# Patient Record
Sex: Female | Born: 1990 | Race: Black or African American | Hispanic: No | Marital: Single | Smoking: Never smoker
Health system: Southern US, Community
[De-identification: ages and names within clinical notes are randomized; demographics above are authoritative.]

## PROBLEM LIST (undated history)

## (undated) DIAGNOSIS — R011 Cardiac murmur, unspecified: Secondary | ICD-10-CM

## (undated) DIAGNOSIS — T884XXA Failed or difficult intubation, initial encounter: Secondary | ICD-10-CM

## (undated) DIAGNOSIS — Z789 Other specified health status: Secondary | ICD-10-CM

## (undated) HISTORY — DX: Other specified health status: Z78.9

## (undated) HISTORY — PX: COSMETIC SURGERY: SHX468

## (undated) HISTORY — PX: WISDOM TOOTH EXTRACTION: SHX21

---

## 2014-08-15 ENCOUNTER — Emergency Department (HOSPITAL_COMMUNITY): Payer: 59

## 2014-08-15 ENCOUNTER — Inpatient Hospital Stay (HOSPITAL_COMMUNITY): Payer: 59

## 2014-08-15 ENCOUNTER — Emergency Department (HOSPITAL_COMMUNITY): Payer: 59 | Admitting: Certified Registered"

## 2014-08-15 ENCOUNTER — Encounter (HOSPITAL_COMMUNITY): Payer: Self-pay | Admitting: Emergency Medicine

## 2014-08-15 ENCOUNTER — Inpatient Hospital Stay (HOSPITAL_COMMUNITY)
Admission: EM | Admit: 2014-08-15 | Discharge: 2014-08-17 | DRG: 208 | Disposition: A | Discharge status: Home / Self Care | Payer: 59 | Attending: Internal Medicine | Admitting: Internal Medicine

## 2014-08-15 DIAGNOSIS — J690 Pneumonitis due to inhalation of food and vomit: Secondary | ICD-10-CM | POA: Diagnosis present

## 2014-08-15 DIAGNOSIS — W19XXXA Unspecified fall, initial encounter: Secondary | ICD-10-CM

## 2014-08-15 DIAGNOSIS — J9600 Acute respiratory failure, unspecified whether with hypoxia or hypercapnia: Secondary | ICD-10-CM

## 2014-08-15 DIAGNOSIS — IMO0002 Reserved for concepts with insufficient information to code with codable children: Secondary | ICD-10-CM | POA: Diagnosis present

## 2014-08-15 DIAGNOSIS — R55 Syncope and collapse: Secondary | ICD-10-CM | POA: Insufficient documentation

## 2014-08-15 DIAGNOSIS — J9690 Respiratory failure, unspecified, unspecified whether with hypoxia or hypercapnia: Secondary | ICD-10-CM | POA: Diagnosis present

## 2014-08-15 DIAGNOSIS — R4182 Altered mental status, unspecified: Secondary | ICD-10-CM | POA: Diagnosis present

## 2014-08-15 DIAGNOSIS — T884XXA Failed or difficult intubation, initial encounter: Secondary | ICD-10-CM

## 2014-08-15 DIAGNOSIS — F10129 Alcohol abuse with intoxication, unspecified: Secondary | ICD-10-CM | POA: Diagnosis present

## 2014-08-15 HISTORY — DX: Cardiac murmur, unspecified: R01.1

## 2014-08-15 HISTORY — DX: Failed or difficult intubation, initial encounter: T88.4XXA

## 2014-08-15 LAB — CBC
HCT: 37.1 % (ref 36.0–46.0)
Hemoglobin: 12.4 g/dL (ref 12.0–15.0)
MCH: 29.5 pg (ref 26.0–34.0)
MCHC: 33.4 g/dL (ref 30.0–36.0)
MCV: 88.1 fL (ref 78.0–100.0)
PLATELETS: 175 10*3/uL (ref 150–400)
RBC: 4.21 MIL/uL (ref 3.87–5.11)
RDW: 13.2 % (ref 11.5–15.5)
WBC: 11.2 10*3/uL — ABNORMAL HIGH (ref 4.0–10.5)

## 2014-08-15 LAB — URINALYSIS, ROUTINE W REFLEX MICROSCOPIC
BILIRUBIN URINE: NEGATIVE
Glucose, UA: NEGATIVE mg/dL
Hgb urine dipstick: NEGATIVE
Ketones, ur: NEGATIVE mg/dL
Leukocytes, UA: NEGATIVE
Nitrite: NEGATIVE
PROTEIN: NEGATIVE mg/dL
Specific Gravity, Urine: 1.03 (ref 1.005–1.030)
UROBILINOGEN UA: 1 mg/dL (ref 0.0–1.0)
pH: 5 (ref 5.0–8.0)

## 2014-08-15 LAB — BLOOD GAS, ARTERIAL
Acid-base deficit: 9 mmol/L — ABNORMAL HIGH (ref 0.0–2.0)
Bicarbonate: 16.7 mEq/L — ABNORMAL LOW (ref 20.0–24.0)
Drawn by: 422461
FIO2: 1 %
O2 Saturation: 98.9 %
PCO2 ART: 34 mmHg — AB (ref 35.0–45.0)
PEEP/CPAP: 5 cmH2O
PH ART: 7.3 — AB (ref 7.350–7.450)
Patient temperature: 95.4
RATE: 16 resp/min
TCO2: 15.4 mmol/L (ref 0–100)
VT: 420 mL
pO2, Arterial: 226 mmHg — ABNORMAL HIGH (ref 80.0–100.0)

## 2014-08-15 LAB — ETHANOL: ALCOHOL ETHYL (B): 225 mg/dL — AB (ref 0–11)

## 2014-08-15 LAB — COMPREHENSIVE METABOLIC PANEL
ALBUMIN: 4.5 g/dL (ref 3.5–5.2)
ALT: 14 U/L (ref 0–35)
AST: 22 U/L (ref 0–37)
Alkaline Phosphatase: 59 U/L (ref 39–117)
Anion gap: 20 — ABNORMAL HIGH (ref 5–15)
BUN: 11 mg/dL (ref 6–23)
CO2: 19 mEq/L (ref 19–32)
CREATININE: 0.73 mg/dL (ref 0.50–1.10)
Calcium: 9.1 mg/dL (ref 8.4–10.5)
Chloride: 104 mEq/L (ref 96–112)
GFR calc Af Amer: >90 mL/min (ref >90)
GFR calc non Af Amer: >90 mL/min (ref >90)
Glucose, Bld: 107 mg/dL — ABNORMAL HIGH (ref 70–99)
POTASSIUM: 3.7 meq/L (ref 3.7–5.3)
Sodium: 143 mEq/L (ref 137–147)
Total Bilirubin: <0.2 mg/dL — ABNORMAL LOW (ref 0.3–1.2)
Total Protein: 7.7 g/dL (ref 6.0–8.3)

## 2014-08-15 LAB — CBC WITH DIFFERENTIAL/PLATELET
BASOS ABS: 0.1 10*3/uL (ref 0.0–0.1)
BASOS PCT: 1 % (ref 0–1)
Eosinophils Absolute: 0.1 10*3/uL (ref 0.0–0.7)
Eosinophils Relative: 1 % (ref 0–5)
HCT: 37.8 % (ref 36.0–46.0)
Hemoglobin: 12.3 g/dL (ref 12.0–15.0)
Lymphocytes Relative: 49 % — ABNORMAL HIGH (ref 12–46)
Lymphs Abs: 4.3 10*3/uL — ABNORMAL HIGH (ref 0.7–4.0)
MCH: 28.7 pg (ref 26.0–34.0)
MCHC: 32.5 g/dL (ref 30.0–36.0)
MCV: 88.1 fL (ref 78.0–100.0)
Monocytes Absolute: 0.6 10*3/uL (ref 0.1–1.0)
Monocytes Relative: 7 % (ref 3–12)
NEUTROS ABS: 3.7 10*3/uL (ref 1.7–7.7)
NEUTROS PCT: 42 % — AB (ref 43–77)
Platelets: 204 10*3/uL (ref 150–400)
RBC: 4.29 MIL/uL (ref 3.87–5.11)
RDW: 13.2 % (ref 11.5–15.5)
WBC: 8.7 10*3/uL (ref 4.0–10.5)

## 2014-08-15 LAB — BASIC METABOLIC PANEL
Anion gap: 19 — ABNORMAL HIGH (ref 5–15)
BUN: 8 mg/dL (ref 6–23)
CALCIUM: 8.7 mg/dL (ref 8.4–10.5)
CO2: 15 mEq/L — ABNORMAL LOW (ref 19–32)
CREATININE: 0.66 mg/dL (ref 0.50–1.10)
Chloride: 108 mEq/L (ref 96–112)
GLUCOSE: 107 mg/dL — AB (ref 70–99)
POTASSIUM: 4 meq/L (ref 3.7–5.3)
Sodium: 142 mEq/L (ref 137–147)

## 2014-08-15 LAB — PREGNANCY, URINE: Preg Test, Ur: NEGATIVE

## 2014-08-15 LAB — RAPID URINE DRUG SCREEN, HOSP PERFORMED
Amphetamines: NOT DETECTED
BARBITURATES: NOT DETECTED
Benzodiazepines: NOT DETECTED
Cocaine: NOT DETECTED
Opiates: NOT DETECTED
TETRAHYDROCANNABINOL: NOT DETECTED

## 2014-08-15 LAB — CBG MONITORING, ED: Glucose-Capillary: 107 mg/dL — ABNORMAL HIGH (ref 70–99)

## 2014-08-15 LAB — LACTIC ACID, PLASMA: LACTIC ACID, VENOUS: 4 mmol/L — AB (ref 0.5–2.2)

## 2014-08-15 LAB — SALICYLATE LEVEL

## 2014-08-15 LAB — MAGNESIUM: Magnesium: 1.7 mg/dL (ref 1.5–2.5)

## 2014-08-15 LAB — PHOSPHORUS: Phosphorus: 1.7 mg/dL — ABNORMAL LOW (ref 2.3–4.6)

## 2014-08-15 LAB — GLUCOSE, CAPILLARY: Glucose-Capillary: 137 mg/dL — ABNORMAL HIGH (ref 70–99)

## 2014-08-15 LAB — MRSA PCR SCREENING: MRSA by PCR: NEGATIVE

## 2014-08-15 LAB — I-STAT CG4 LACTIC ACID, ED: Lactic Acid, Venous: 2.87 mmol/L — ABNORMAL HIGH (ref 0.5–2.2)

## 2014-08-15 LAB — ACETAMINOPHEN LEVEL

## 2014-08-15 MED ORDER — ROCURONIUM BROMIDE 50 MG/5ML IV SOLN
20.0000 mg | Freq: Once | INTRAVENOUS | Status: AC
  Administered 2014-08-15: 20 mg via INTRAVENOUS

## 2014-08-15 MED ORDER — MAGNESIUM SULFATE 50 % IJ SOLN: 2.0000 g | Freq: Once | INTRAVENOUS | Status: DC

## 2014-08-15 MED ORDER — DEXAMETHASONE SODIUM PHOSPHATE 10 MG/ML IJ SOLN
10.0000 mg | Freq: Once | INTRAMUSCULAR | Status: AC
  Administered 2014-08-15: 10 mg via INTRAVENOUS
  Filled 2014-08-15: qty 1

## 2014-08-15 MED ORDER — ETOMIDATE 2 MG/ML IV SOLN
10.0000 mg | Freq: Once | INTRAVENOUS | Status: AC
  Administered 2014-08-15: 10 mg via INTRAVENOUS

## 2014-08-15 MED ORDER — SUCCINYLCHOLINE CHLORIDE 20 MG/ML IJ SOLN
INTRAMUSCULAR | Status: AC
  Filled 2014-08-15: qty 1

## 2014-08-15 MED ORDER — ROCURONIUM BROMIDE 50 MG/5ML IV SOLN
INTRAVENOUS | Status: AC
  Administered 2014-08-15: 80 mg via INTRAVENOUS
  Filled 2014-08-15: qty 2

## 2014-08-15 MED ORDER — ENOXAPARIN SODIUM 40 MG/0.4ML ~~LOC~~ SOLN
40.0000 mg | Freq: Every day | SUBCUTANEOUS | Status: DC
  Administered 2014-08-15 – 2014-08-16 (×2): 40 mg via SUBCUTANEOUS
  Filled 2014-08-15 (×3): qty 0.4

## 2014-08-15 MED ORDER — ASPIRIN 300 MG RE SUPP: 300.0000 mg | RECTAL | Status: AC

## 2014-08-15 MED ORDER — FENTANYL CITRATE 0.05 MG/ML IJ SOLN
100.0000 ug | INTRAMUSCULAR | Status: DC | PRN
  Administered 2014-08-15 – 2014-08-16 (×3): 100 ug via INTRAVENOUS
  Filled 2014-08-15 (×3): qty 2

## 2014-08-15 MED ORDER — MIDAZOLAM HCL 2 MG/2ML IJ SOLN
2.0000 mg | Freq: Once | INTRAMUSCULAR | Status: AC
  Administered 2014-08-15: 2 mg via INTRAVENOUS
  Filled 2014-08-15: qty 2

## 2014-08-15 MED ORDER — SODIUM CHLORIDE 0.9 % IV BOLUS (SEPSIS)
2000.0000 mL | Freq: Once | INTRAVENOUS | Status: AC
  Administered 2014-08-15 (×2): 1000 mL via INTRAVENOUS

## 2014-08-15 MED ORDER — CHLORHEXIDINE GLUCONATE 0.12 % MT SOLN
15.0000 mL | Freq: Two times a day (BID) | OROMUCOSAL | Status: DC
  Administered 2014-08-15 – 2014-08-17 (×5): 15 mL via OROMUCOSAL
  Filled 2014-08-15 (×4): qty 15

## 2014-08-15 MED ORDER — ASPIRIN 81 MG PO CHEW: 324.0000 mg | CHEWABLE_TABLET | ORAL | Status: AC

## 2014-08-15 MED ORDER — GLYCOPYRROLATE 0.2 MG/ML IJ SOLN
0.2000 mg | Freq: Once | INTRAMUSCULAR | Status: AC
  Administered 2014-08-15: 0.2 mg via INTRAVENOUS
  Filled 2014-08-15: qty 1

## 2014-08-15 MED ORDER — SODIUM CHLORIDE 0.9 % IV BOLUS (SEPSIS): 2000.0000 mL | Freq: Once | INTRAVENOUS | Status: DC

## 2014-08-15 MED ORDER — ETOMIDATE 2 MG/ML IV SOLN
INTRAVENOUS | Status: AC
  Administered 2014-08-15: 20 mg via INTRAVENOUS
  Filled 2014-08-15: qty 20

## 2014-08-15 MED ORDER — INFLUENZA VAC SPLIT QUAD 0.5 ML IM SUSY
0.5000 mL | PREFILLED_SYRINGE | INTRAMUSCULAR | Status: AC
Start: 2014-08-16 — End: 2014-08-17
  Administered 2014-08-17: 0.5 mL via INTRAMUSCULAR
  Filled 2014-08-15: qty 0.5

## 2014-08-15 MED ORDER — PIPERACILLIN-TAZOBACTAM 3.375 G IVPB: 3.3750 g | Freq: Four times a day (QID) | INTRAVENOUS | Status: DC

## 2014-08-15 MED ORDER — FENTANYL CITRATE 0.05 MG/ML IJ SOLN
100.0000 ug | INTRAMUSCULAR | Status: AC | PRN
  Administered 2014-08-15 (×3): 100 ug via INTRAVENOUS
  Filled 2014-08-15 (×3): qty 2

## 2014-08-15 MED ORDER — ETOMIDATE 2 MG/ML IV SOLN
INTRAVENOUS | Status: AC
  Filled 2014-08-15: qty 10

## 2014-08-15 MED ORDER — VANCOMYCIN HCL IN DEXTROSE 1-5 GM/200ML-% IV SOLN
1000.0000 mg | Freq: Once | INTRAVENOUS | Status: DC
  Administered 2014-08-15: 1000 mg via INTRAVENOUS
  Filled 2014-08-15: qty 200

## 2014-08-15 MED ORDER — PIPERACILLIN-TAZOBACTAM 3.375 G IVPB 30 MIN
3.3750 g | Freq: Once | INTRAVENOUS | Status: DC
  Filled 2014-08-15: qty 50

## 2014-08-15 MED ORDER — ACETAMINOPHEN 160 MG/5ML PO SOLN
650.0000 mg | Freq: Four times a day (QID) | ORAL | Status: DC | PRN
  Filled 2014-08-15: qty 20.3

## 2014-08-15 MED ORDER — PROPOFOL 10 MG/ML IV EMUL: 5.0000 ug/kg/min | INTRAVENOUS | Status: DC

## 2014-08-15 MED ORDER — SODIUM CHLORIDE 0.9 % IV SOLN
10.0000 ug/h | INTRAVENOUS | Status: DC
  Filled 2014-08-15: qty 50

## 2014-08-15 MED ORDER — LIDOCAINE HCL 2 % EX GEL
CUTANEOUS | Status: AC
  Administered 2014-08-15: 1 via ENDOTRACHEOPULMONARY
  Filled 2014-08-15: qty 10

## 2014-08-15 MED ORDER — SODIUM PHOSPHATE 3 MMOLE/ML IV SOLN
20.0000 mmol | Freq: Once | INTRAVENOUS | Status: AC
  Administered 2014-08-15: 20 mmol via INTRAVENOUS
  Filled 2014-08-15: qty 6.67

## 2014-08-15 MED ORDER — ACETAMINOPHEN 160 MG/5ML PO SOLN
650.0000 mg | Freq: Once | ORAL | Status: AC
  Administered 2014-08-15: 650 mg

## 2014-08-15 MED ORDER — PIPERACILLIN-TAZOBACTAM 3.375 G IVPB
3.3750 g | Freq: Three times a day (TID) | INTRAVENOUS | Status: DC
  Administered 2014-08-15 – 2014-08-17 (×6): 3.375 g via INTRAVENOUS
  Filled 2014-08-15 (×8): qty 50

## 2014-08-15 MED ORDER — PIPERACILLIN-TAZOBACTAM 3.375 G IVPB 30 MIN
3.3750 g | Freq: Once | INTRAVENOUS | Status: AC
  Administered 2014-08-15: 3.375 g via INTRAVENOUS
  Filled 2014-08-15: qty 50

## 2014-08-15 MED ORDER — VANCOMYCIN HCL IN DEXTROSE 1-5 GM/200ML-% IV SOLN: 1000.0000 mg | Freq: Two times a day (BID) | INTRAVENOUS | Status: DC

## 2014-08-15 MED ORDER — MAGNESIUM SULFATE 2 GM/50ML IV SOLN
2.0000 g | Freq: Once | INTRAVENOUS | Status: AC
  Administered 2014-08-15: 2 g via INTRAVENOUS
  Filled 2014-08-15: qty 50

## 2014-08-15 MED ORDER — VANCOMYCIN HCL IN DEXTROSE 1-5 GM/200ML-% IV SOLN
1000.0000 mg | Freq: Two times a day (BID) | INTRAVENOUS | Status: DC
  Administered 2014-08-15 – 2014-08-17 (×5): 1000 mg via INTRAVENOUS
  Filled 2014-08-15 (×6): qty 200

## 2014-08-15 MED ORDER — PANTOPRAZOLE SODIUM 40 MG IV SOLR
40.0000 mg | Freq: Every day | INTRAVENOUS | Status: DC
  Administered 2014-08-15 – 2014-08-16 (×2): 40 mg via INTRAVENOUS
  Filled 2014-08-15 (×3): qty 40

## 2014-08-15 MED ORDER — SODIUM CHLORIDE 0.9 % IV SOLN: 250.0000 mL | INTRAVENOUS | Status: DC | PRN

## 2014-08-15 MED ORDER — LIDOCAINE HCL (CARDIAC) 20 MG/ML IV SOLN
INTRAVENOUS | Status: AC
  Filled 2014-08-15: qty 5

## 2014-08-15 NOTE — ED Notes (Signed)
Critical care MD at bedside speaking w/ family.

## 2014-08-15 NOTE — ED Provider Notes (Signed)
CSN: [id number]     Arrival date & time 08/15/14  0145 History   First MD Initiated Contact with Patient 08/15/14 0206     Chief Complaint  Patient presents with  . Altered Mental Status     (Consider location/radiation/quality/duration/timing/severity/associated sxs/prior Treatment) Patient is a 23 y.o. female presenting with altered mental status. The history is provided by the EMS personnel. The history is limited by the condition of the patient (unresponsive).  Altered Mental Status Presenting symptoms: unresponsiveness   Severity:  Severe Most recent episode:  Today Episode history:  Single Timing:  Constant Progression:  Unchanged Chronicity:  New Context: alcohol use   Context comment:  Fall forward breaking of left upper front tooth Associated symptoms: vomiting   Associated symptoms: no rash   Associated symptoms comment:  Vomiting prior to EMS arrival and multiple for EMS   Past Medical History  Diagnosis Date  . Heart murmur    History reviewed. No pertinent past surgical history. History reviewed. No pertinent family history. History  Substance Use Topics  . Smoking status: Unknown If Ever Smoked  . Smokeless tobacco: Not on file  . Alcohol Use: Yes   OB History    No data available     Review of Systems  Unable to perform ROS Gastrointestinal: Positive for vomiting.  Skin: Negative for rash.      Allergies  Review of patient's allergies indicates no known allergies.  Home Medications   Prior to Admission medications   Not on File   BP 121/67 mmHg  Pulse 121  Resp 26  SpO2 100%  LMP  (LMP Unknown) Physical Exam  Constitutional: She appears well-developed.  HENT:  Head: Head is without raccoon's eyes and without Battle's sign.  Right Ear: No hemotympanum.  Mouth/Throat: Oropharynx is clear and moist. No posterior oropharyngeal erythema.    Eyes: Conjunctivae are normal.  Neck:  In c collar trachea midline  Cardiovascular: Intact  distal pulses.  Tachycardia present.   Pulmonary/Chest: She has rhonchi.  Abdominal: Bowel sounds are normal. She exhibits no mass. There is no guarding.  Musculoskeletal: She exhibits no edema.  Neurological: GCS eye subscore is 1. GCS verbal subscore is 1. GCS motor subscore is 4.  Skin: Skin is warm and dry. She is not diaphoretic.  Psychiatric:  unable    ED Course  Procedures (including critical care time) Labs Review Labs Reviewed  CBC WITH DIFFERENTIAL - Abnormal; Notable for the following:    Neutrophils Relative % 42 (*)    Lymphocytes Relative 49 (*)    Lymphs Abs 4.3 (*)    All other components within normal limits  COMPREHENSIVE METABOLIC PANEL - Abnormal; Notable for the following:    Glucose, Bld 107 (*)    Total Bilirubin <0.2 (*)    Anion gap 20 (*)    All other components within normal limits  ETHANOL - Abnormal; Notable for the following:    Alcohol, Ethyl (B) 225 (*)    All other components within normal limits  SALICYLATE LEVEL - Abnormal; Notable for the following:    Salicylate Lvl <2.0 (*)    All other components within normal limits  I-STAT CG4 LACTIC ACID, ED - Abnormal; Notable for the following:    Lactic Acid, Venous 2.87 (*)    All other components within normal limits  CBG MONITORING, ED - Abnormal; Notable for the following:    Glucose-Capillary 107 (*)    All other components within normal limits  CULTURE, BLOOD (  ROUTINE X 2)  CULTURE, BLOOD (ROUTINE X 2)  CULTURE, BLOOD (ROUTINE X 2)  CULTURE, BLOOD (ROUTINE X 2)  ACETAMINOPHEN LEVEL  PREGNANCY, URINE  URINALYSIS, ROUTINE W REFLEX MICROSCOPIC  URINE RAPID DRUG SCREEN (HOSP PERFORMED)  CBC  CREATININE, SERUM  BASIC METABOLIC PANEL  BLOOD GAS, ARTERIAL  MAGNESIUM  PHOSPHORUS  CBC  LACTIC ACID, PLASMA    Imaging Review Ct Head Wo Contrast  08/15/2014   CLINICAL DATA:  Patient drank a pt of liquor and probable forward, falling. Patient has abrasions to the left knee with left  frontal 2 chipped. Minimally responsive. Altered mental status.  EXAM: CT HEAD WITHOUT CONTRAST  CT CERVICAL SPINE WITHOUT CONTRAST  TECHNIQUE: Multidetector CT imaging of the head and cervical spine was performed following the standard protocol without intravenous contrast. Multiplanar CT image reconstructions of the cervical spine were also generated.  COMPARISON:  None.  FINDINGS: CT HEAD FINDINGS  Ventricles and sulci appear symmetrical. No mass effect or midline shift. No abnormal extra-axial fluid collections. Gray-white matter junctions are distinct. Basal cisterns are not effaced. No evidence of acute intracranial hemorrhage. No depressed skull fractures. Air-fluid levels are demonstrated in both maxillary antra. This could indicate pre-existing sinusitis or may be due to trauma. No visualized facial fractures identified within the field of view. Mastoid air cells are not opacified.  CT CERVICAL SPINE FINDINGS  Reversal of the usual cervical lordosis. This may be due to patient positioning but ligamentous injury or muscle spasm could also have this appearance and is not excluded. No anterior subluxation. Normal alignment of the facet joints. C1-2 articulation appears intact. No prevertebral soft tissue swelling. Endotracheal tube is visualized. No vertebral compression deformities. No focal bone lesion or bone destruction. Bone cortex and trabecular architecture appear intact. Soft tissues are unremarkable. There is opacification of the left lung apex suggesting a large pleural effusion or consolidation.  IMPRESSION: No acute intracranial abnormalities. Nonspecific air-fluid levels in the maxillary antra.  Nonspecific reversal of the usual cervical lordosis. No displaced fractures identified. Opacification of the visualized left apex which may indicate large effusion or consolidation.   Electronically Signed   By: Burman Nieves M.D.   On: 08/15/2014 03:30   Ct Cervical Spine Wo Contrast  08/15/2014    CLINICAL DATA:  Patient drank a pt of liquor and probable forward, falling. Patient has abrasions to the left knee with left frontal 2 chipped. Minimally responsive. Altered mental status.  EXAM: CT HEAD WITHOUT CONTRAST  CT CERVICAL SPINE WITHOUT CONTRAST  TECHNIQUE: Multidetector CT imaging of the head and cervical spine was performed following the standard protocol without intravenous contrast. Multiplanar CT image reconstructions of the cervical spine were also generated.  COMPARISON:  None.  FINDINGS: CT HEAD FINDINGS  Ventricles and sulci appear symmetrical. No mass effect or midline shift. No abnormal extra-axial fluid collections. Gray-white matter junctions are distinct. Basal cisterns are not effaced. No evidence of acute intracranial hemorrhage. No depressed skull fractures. Air-fluid levels are demonstrated in both maxillary antra. This could indicate pre-existing sinusitis or may be due to trauma. No visualized facial fractures identified within the field of view. Mastoid air cells are not opacified.  CT CERVICAL SPINE FINDINGS  Reversal of the usual cervical lordosis. This may be due to patient positioning but ligamentous injury or muscle spasm could also have this appearance and is not excluded. No anterior subluxation. Normal alignment of the facet joints. C1-2 articulation appears intact. No prevertebral soft tissue swelling. Endotracheal tube is visualized.  No vertebral compression deformities. No focal bone lesion or bone destruction. Bone cortex and trabecular architecture appear intact. Soft tissues are unremarkable. There is opacification of the left lung apex suggesting a large pleural effusion or consolidation.  IMPRESSION: No acute intracranial abnormalities. Nonspecific air-fluid levels in the maxillary antra.  Nonspecific reversal of the usual cervical lordosis. No displaced fractures identified. Opacification of the visualized left apex which may indicate large effusion or consolidation.    Electronically Signed   By: Burman Nieves M.D.   On: 08/15/2014 03:30   Dg Chest Port 1 View  08/15/2014   CLINICAL DATA:  Found down, intoxication. Minimally responsive to sternal rubs.  EXAM: PORTABLE CHEST - 1 VIEW  COMPARISON:  None.  FINDINGS: Endotracheal tube tip projects 3.5 cm above the carina. No pneumothorax.  Dense consolidation RIGHT upper lobe, with volume loss and patchy RIGHT mid and lower lung zone airspace opacities. Tapering in the RIGHT upper lobe bronchus. No pleural effusions. Cardiac silhouette appears normal in size.  Gas distended stomach.  Osseous structures unremarkable.  IMPRESSION: Endotracheal tube tip projects 3.5 cm above the carina.  Dense consolidation RIGHT upper lobe with apparent tapering of the RIGHT upper lobe bronchus concerning for aspiration, possible endo bronchial lesion/mass. Patchy airspace opacity in the RIGHT lung may be infectious or inflammatory.   Electronically Signed   By: Awilda Metro   On: 08/15/2014 03:55     EKG Interpretation None      MDM   Final diagnoses:  Fall  Aspiration pneumonia, unspecified aspiration pneumonia type    INTUBATION Performed by: Jasmine Awe  Required items: required blood products, implants, devices, and special equipment available Patient identity confirmed: provided demographic data and hospital-assigned identification number Time out: Immediately prior to procedure a "time out" was called to verify the correct patient, procedure, equipment, support staff and site/side marked as required.  Indications: unresponsive no gag   Intubation method: Glidescope Laryngoscopy   Preoxygenation: \BVM  Sedatives: 20 mg Etomidate Paralytic: 100 mg rocuronium  Tube Size: 7.0  cuffed   3 attempted with Cormack Lehane grade 1 view unable to pass tube, anesthesia called and took over airway.  CRNA arrived and then assisted by anesthesia attending seen their note   no immediate complications by  EDP  Results for orders placed or performed during the hospital encounter of 08/15/14  CBC with Differential  Result Value Ref Range   WBC 8.7 4.0 - 10.5 K/uL   RBC 4.29 3.87 - 5.11 MIL/uL   Hemoglobin 12.3 12.0 - 15.0 g/dL   HCT 29.5 62.1 - 30.8 %   MCV 88.1 78.0 - 100.0 fL   MCH 28.7 26.0 - 34.0 pg   MCHC 32.5 30.0 - 36.0 g/dL   RDW 65.7 84.6 - 96.2 %   Platelets 204 150 - 400 K/uL   Neutrophils Relative % 42 (L) 43 - 77 %   Neutro Abs 3.7 1.7 - 7.7 K/uL   Lymphocytes Relative 49 (H) 12 - 46 %   Lymphs Abs 4.3 (H) 0.7 - 4.0 K/uL   Monocytes Relative 7 3 - 12 %   Monocytes Absolute 0.6 0.1 - 1.0 K/uL   Eosinophils Relative 1 0 - 5 %   Eosinophils Absolute 0.1 0.0 - 0.7 K/uL   Basophils Relative 1 0 - 1 %   Basophils Absolute 0.1 0.0 - 0.1 K/uL  Comprehensive metabolic panel  Result Value Ref Range   Sodium 143 137 - 147 mEq/L   Potassium  3.7 3.7 - 5.3 mEq/L   Chloride 104 96 - 112 mEq/L   CO2 19 19 - 32 mEq/L   Glucose, Bld 107 (H) 70 - 99 mg/dL   BUN 11 6 - 23 mg/dL   Creatinine, Ser 1.61 0.50 - 1.10 mg/dL   Calcium 9.1 8.4 - 09.6 mg/dL   Total Protein 7.7 6.0 - 8.3 g/dL   Albumin 4.5 3.5 - 5.2 g/dL   AST 22 0 - 37 U/L   ALT 14 0 - 35 U/L   Alkaline Phosphatase 59 39 - 117 U/L   Total Bilirubin <0.2 (L) 0.3 - 1.2 mg/dL   GFR calc non Af Amer >90 >90 mL/min   GFR calc Af Amer >90 >90 mL/min   Anion gap 20 (H) 5 - 15  Ethanol  Result Value Ref Range   Alcohol, Ethyl (B) 225 (H) 0 - 11 mg/dL  Acetaminophen level  Result Value Ref Range   Acetaminophen (Tylenol), Serum <15.0 10 - 30 ug/mL  Salicylate level  Result Value Ref Range   Salicylate Lvl <2.0 (L) 2.8 - 20.0 mg/dL  Pregnancy, urine  Result Value Ref Range   Preg Test, Ur NEGATIVE NEGATIVE  Urinalysis, Routine w reflex microscopic  Result Value Ref Range   Color, Urine YELLOW YELLOW   APPearance CLEAR CLEAR   Specific Gravity, Urine 1.030 1.005 - 1.030   pH 5.0 5.0 - 8.0   Glucose, UA NEGATIVE  NEGATIVE mg/dL   Hgb urine dipstick NEGATIVE NEGATIVE   Bilirubin Urine NEGATIVE NEGATIVE   Ketones, ur NEGATIVE NEGATIVE mg/dL   Protein, ur NEGATIVE NEGATIVE mg/dL   Urobilinogen, UA 1.0 0.0 - 1.0 mg/dL   Nitrite NEGATIVE NEGATIVE   Leukocytes, UA NEGATIVE NEGATIVE  Urine rapid drug screen (hosp performed)  Result Value Ref Range   Opiates NONE DETECTED NONE DETECTED   Cocaine NONE DETECTED NONE DETECTED   Benzodiazepines NONE DETECTED NONE DETECTED   Amphetamines NONE DETECTED NONE DETECTED   Tetrahydrocannabinol NONE DETECTED NONE DETECTED   Barbiturates NONE DETECTED NONE DETECTED  I-Stat CG4 Lactic Acid, ED  Result Value Ref Range   Lactic Acid, Venous 2.87 (H) 0.5 - 2.2 mmol/L  CBG monitoring, ED  Result Value Ref Range   Glucose-Capillary 107 (H) 70 - 99 mg/dL   Ct Head Wo Contrast  08/15/2014   CLINICAL DATA:  Patient drank a pt of liquor and probable forward, falling. Patient has abrasions to the left knee with left frontal 2 chipped. Minimally responsive. Altered mental status.  EXAM: CT HEAD WITHOUT CONTRAST  CT CERVICAL SPINE WITHOUT CONTRAST  TECHNIQUE: Multidetector CT imaging of the head and cervical spine was performed following the standard protocol without intravenous contrast. Multiplanar CT image reconstructions of the cervical spine were also generated.  COMPARISON:  None.  FINDINGS: CT HEAD FINDINGS  Ventricles and sulci appear symmetrical. No mass effect or midline shift. No abnormal extra-axial fluid collections. Gray-white matter junctions are distinct. Basal cisterns are not effaced. No evidence of acute intracranial hemorrhage. No depressed skull fractures. Air-fluid levels are demonstrated in both maxillary antra. This could indicate pre-existing sinusitis or may be due to trauma. No visualized facial fractures identified within the field of view. Mastoid air cells are not opacified.  CT CERVICAL SPINE FINDINGS  Reversal of the usual cervical lordosis. This may  be due to patient positioning but ligamentous injury or muscle spasm could also have this appearance and is not excluded. No anterior subluxation. Normal alignment of the facet joints.  C1-2 articulation appears intact. No prevertebral soft tissue swelling. Endotracheal tube is visualized. No vertebral compression deformities. No focal bone lesion or bone destruction. Bone cortex and trabecular architecture appear intact. Soft tissues are unremarkable. There is opacification of the left lung apex suggesting a large pleural effusion or consolidation.  IMPRESSION: No acute intracranial abnormalities. Nonspecific air-fluid levels in the maxillary antra.  Nonspecific reversal of the usual cervical lordosis. No displaced fractures identified. Opacification of the visualized left apex which may indicate large effusion or consolidation.   Electronically Signed   By: Burman NievesWilliam  Stevens M.D.   On: 08/15/2014 03:30   Ct Cervical Spine Wo Contrast  08/15/2014   CLINICAL DATA:  Patient drank a pt of liquor and probable forward, falling. Patient has abrasions to the left knee with left frontal 2 chipped. Minimally responsive. Altered mental status.  EXAM: CT HEAD WITHOUT CONTRAST  CT CERVICAL SPINE WITHOUT CONTRAST  TECHNIQUE: Multidetector CT imaging of the head and cervical spine was performed following the standard protocol without intravenous contrast. Multiplanar CT image reconstructions of the cervical spine were also generated.  COMPARISON:  None.  FINDINGS: CT HEAD FINDINGS  Ventricles and sulci appear symmetrical. No mass effect or midline shift. No abnormal extra-axial fluid collections. Gray-white matter junctions are distinct. Basal cisterns are not effaced. No evidence of acute intracranial hemorrhage. No depressed skull fractures. Air-fluid levels are demonstrated in both maxillary antra. This could indicate pre-existing sinusitis or may be due to trauma. No visualized facial fractures identified within the field  of view. Mastoid air cells are not opacified.  CT CERVICAL SPINE FINDINGS  Reversal of the usual cervical lordosis. This may be due to patient positioning but ligamentous injury or muscle spasm could also have this appearance and is not excluded. No anterior subluxation. Normal alignment of the facet joints. C1-2 articulation appears intact. No prevertebral soft tissue swelling. Endotracheal tube is visualized. No vertebral compression deformities. No focal bone lesion or bone destruction. Bone cortex and trabecular architecture appear intact. Soft tissues are unremarkable. There is opacification of the left lung apex suggesting a large pleural effusion or consolidation.  IMPRESSION: No acute intracranial abnormalities. Nonspecific air-fluid levels in the maxillary antra.  Nonspecific reversal of the usual cervical lordosis. No displaced fractures identified. Opacification of the visualized left apex which may indicate large effusion or consolidation.   Electronically Signed   By: Burman NievesWilliam  Stevens M.D.   On: 08/15/2014 03:30   Dg Chest Port 1 View  08/15/2014   CLINICAL DATA:  Found down, intoxication. Minimally responsive to sternal rubs.  EXAM: PORTABLE CHEST - 1 VIEW  COMPARISON:  None.  FINDINGS: Endotracheal tube tip projects 3.5 cm above the carina. No pneumothorax.  Dense consolidation RIGHT upper lobe, with volume loss and patchy RIGHT mid and lower lung zone airspace opacities. Tapering in the RIGHT upper lobe bronchus. No pleural effusions. Cardiac silhouette appears normal in size.  Gas distended stomach.  Osseous structures unremarkable.  IMPRESSION: Endotracheal tube tip projects 3.5 cm above the carina.  Dense consolidation RIGHT upper lobe with apparent tapering of the RIGHT upper lobe bronchus concerning for aspiration, possible endo bronchial lesion/mass. Patchy airspace opacity in the RIGHT lung may be infectious or inflammatory.   Electronically Signed   By: Awilda Metroourtnay  Bloomer   On: 08/15/2014  03:55    MDM Reviewed: nursing note and vitals Interpretation: labs and ECG (no elevation in WBC, CT head negative by me.  Elevated etoh and lactate,) Total time providing critical care: 30-74  minutes. This excludes time spent performing separately reportable procedures and services. Consults: pulmonary (anesthesia)   Medications  lidocaine (cardiac) 100 mg/3ml (XYLOCAINE) 20 MG/ML injection 2% (  Not Given 08/15/14 0305)  succinylcholine (ANECTINE) 20 MG/ML injection (  Not Given 08/15/14 0305)  aspirin chewable tablet 324 mg (not administered)    Or  aspirin suppository 300 mg (not administered)  0.9 %  sodium chloride infusion (not administered)  enoxaparin (LOVENOX) injection 40 mg (not administered)  sodium chloride 0.9 % bolus 2,000 mL (not administered)  fentaNYL (SUBLIMAZE) injection 100 mcg (not administered)  fentaNYL (SUBLIMAZE) injection 100 mcg (not administered)  rocuronium (ZEMURON) 50 MG/5ML injection (80 mg Intravenous Given 08/15/14 0155)  etomidate (AMIDATE) 2 MG/ML injection (20 mg Intravenous Given 08/15/14 0155)  glycopyrrolate (ROBINUL) injection 0.2 mg (0.2 mg Intravenous Given 08/15/14 0241)  dexamethasone (DECADRON) injection 10 mg (10 mg Intravenous Given 08/15/14 0241)  lidocaine (XYLOCAINE) 2 % jelly (1 application Endotracheal Given 08/15/14 0250)  rocuronium (ZEMURON) injection 20 mg (20 mg Intravenous Given 08/15/14 0157)  sodium chloride 0.9 % bolus 2,000 mL (1,000 mLs Intravenous New Bag/Given 08/15/14 0259)   CRITICAL CARE Performed by: Jasmine Awe Total critical care time: 61 minutes Critical care time was exclusive of separately billable procedures and treating other patients. Critical care was necessary to treat or prevent imminent or life-threatening deterioration. Critical care was time spent personally by me on the following activities: development of treatment plan with patient and/or surrogate as well as nursing, discussions with  consultants, evaluation of patient's response to treatment, examination of patient, obtaining history from patient or surrogate, ordering and performing treatments and interventions, ordering and review of laboratory studies, ordering and review of radiographic studies, pulse oximetry and re-evaluation of patient's condition.         Jasmine Awe, MD 08/15/14 [phone]

## 2014-08-15 NOTE — Progress Notes (Signed)
INITIAL NUTRITION ASSESSMENT  DOCUMENTATION CODES Per approved criteria  -Not Applicable   INTERVENTION: As warranted: Initiate TF via NGT with Vital 1.5 at 25 ml/h and Prostat 30 ml BID on day 1; on day 2, d/c Pro-Stat and increase to goal rate of 50 ml/h (1200 ml per day) to provide 1800 kcals, 81 gm protein, 1008 ml free water daily. -RD to continue to monitor  NUTRITION DIAGNOSIS: Inadequate oral intake related to inability to eat as evidenced by NPO status.   Goal: TF to meet >/= 90% of their estimated nutrition needs    Monitor:  TF order vs Diet order, total protein/energy intake, labs, weights, GI profile  Reason for Assessment: Vent  23 y.o. female  Admitting Dx: <principal problem not specified>  ASSESSMENT: 23 y/o woman with no PMHx presenting w/ intoxication and aspiration  -Pt undergoing bronchoscopy during time of RD assessment -Pt intubated < 24 hours, no current plan for TF. Recommend to initiate on 12/13 if unable to extubate. Has access via NGT -MD noted some vomiting, likely r/t to ETOH Patient is currently intubated on ventilator support MV: 8.2 L/min Temp (24hrs), Avg:99.1 F (37.3 C), Min:95 F (35 C), Max:101.6 F (38.7 C)  Height: Ht Readings from Last 1 Encounters:  08/15/14 5\' 5"  (1.651 m)    Weight: Wt Readings from Last 1 Encounters:  08/15/14 130 lb 8.2 oz (59.2 kg)    Ideal Body Weight: 125 lb  % Ideal Body Weight: 104%  Wt Readings from Last 10 Encounters:  08/15/14 130 lb 8.2 oz (59.2 kg)    Usual Body Weight: unable to determine  % Usual Body Weight: unable to determine  BMI:  Body mass index is 21.72 kg/(m^2).  Estimated Nutritional Needs: Kcal: 1798 Protein:65-75 gram Fluid: >/=1800 ml/daily  Skin: WDL  Diet Order: Diet NPO time specified  EDUCATION NEEDS: -No education needs identified at this time   Intake/Output Summary (Last 24 hours) at 08/15/14 1611 Last data filed at 08/15/14 1400  Gross per 24  hour  Intake    360 ml  Output   1250 ml  Net   -890 ml    Last BM: PTA  Labs:   Recent Labs Lab 08/15/14 0224 08/15/14 0730  NA 143 142  K 3.7 4.0  CL 104 108  CO2 19 15*  BUN 11 8  CREATININE 0.73 0.66  CALCIUM 9.1 8.7  MG  --  1.7  PHOS  --  1.7*  GLUCOSE 107* 107*    CBG (last 3)   Recent Labs  08/15/14 0158 08/15/14 0547  GLUCAP 107* 137*    Scheduled Meds: . aspirin  324 mg Oral NOW   Or  . aspirin  300 mg Rectal NOW  . chlorhexidine  15 mL Mouth/Throat BID  . enoxaparin (LOVENOX) injection  40 mg Subcutaneous Daily  . etomidate      . [START ON 08/16/2014] Influenza vac split quadrivalent PF  0.5 mL Intramuscular Tomorrow-1000  . magnesium sulfate 1 - 4 g bolus IVPB  2 g Intravenous Once  . pantoprazole (PROTONIX) IV  40 mg Intravenous QHS  . piperacillin-tazobactam (ZOSYN)  IV  3.375 g Intravenous 3 times per day  . sodium chloride  2,000 mL Intravenous Once  . sodium phosphate  Dextrose 5% IVPB  20 mmol Intravenous Once  . vancomycin  1,000 mg Intravenous BID    Continuous Infusions: . propofol      Past Medical History  Diagnosis Date  . Heart  murmur     History reviewed. No pertinent past surgical history.  Lloyd HugerSarah F Zoya Sprecher MS RD LDN Clinical Dietitian [phone]

## 2014-08-15 NOTE — ED Notes (Signed)
Pt returned from CT via stretcher, RT x 2 at bedside.

## 2014-08-15 NOTE — ED Notes (Signed)
Patient transported to CT 

## 2014-08-15 NOTE — Procedures (Signed)
Bronchoscopy Procedure Note Rachael RistDanielle [name] [name] 1991/07/02  Procedure: Bronchoscopy Indications: Remove secretions  Procedure Details Consent: Risks of procedure as well as the alternatives and risks of each were explained to the (patient/caregiver).  Consent for procedure obtained. Time Out: Verified patient identification, verified procedure, site/side was marked, verified correct patient position, special equipment/implants available, medications/allergies/relevent history reviewed, required imaging and test results available.  Performed  In preparation for procedure, patient was given 100% FiO2 and bronchoscope lubricated. Sedation: Etomidate  Airway entered and the following bronchi were examined: RUL, RML, RLL, LUL, LLL and Bronchi.   Procedures performed: Brushings performed Bronchoscope removed.    Evaluation Hemodynamic Status: BP stable throughout; O2 sats: stable throughout Patient's Current Condition: stable Specimens:  None Complications: No apparent complications Patient did tolerate procedure well.   Nelda BucksFEINSTEIN,DANIEL J. 08/15/2014  1. Slight secretions RUL, did lavage , some distal plugs noted removed 2. All other lobes wnl  Mcarthur Rossettianiel J. Tyson AliasFeinstein, MD, FACP Pgr: [phone] [facility]

## 2014-08-15 NOTE — Progress Notes (Signed)
ANTIBIOTIC CONSULT NOTE - INITIAL  Pharmacy Consult for Vancomycin and Zosyn  Indication: rule out pneumonia  No Known Allergies  Patient Measurements: Height: 5\' 5"  (165.1 cm) Weight: 130 lb 8.2 oz (59.2 kg) IBW/kg (Calculated) : 57  Vital Signs: Temp: 95.4 F (35.2 C) (12/12 0445) BP: 109/53 mmHg (12/12 0403) Pulse Rate: 133 (12/12 0445) Intake/Output from previous day: 12/11 0701 - 12/12 0700 In: -  Out: 50 [Urine:50] Intake/Output from this shift: Total I/O In: -  Out: 50 [Urine:50]  Labs:  Recent Labs  08/15/14 0224  WBC 8.7  HGB 12.3  PLT 204  CREATININE 0.73   Estimated Creatinine Clearance: 98.4 mL/min (by C-G formula based on Cr of 0.73). No results for input(s): VANCOTROUGH, VANCOPEAK, VANCORANDOM, GENTTROUGH, GENTPEAK, GENTRANDOM, TOBRATROUGH, TOBRAPEAK, TOBRARND, AMIKACINPEAK, AMIKACINTROU, AMIKACIN in the last 72 hours.   Microbiology: No results found for this or any previous visit (from the past 720 hour(s)).  Medical History: Past Medical History  Diagnosis Date  . Heart murmur     Medications:  No prescriptions prior to admission   Assessment: 23 yo female with VDRF, possible aspiration, for empiric antibiotics.  Vancomycin 1 g IV given in ED at  0400  Goal of Therapy:  Vancomycin trough level 15-20 mcg/ml  Plan:  Vancomycin 1 g IV q12h Zosyn 3.375 g IV q8h   Rachael [name], Rachael [name] Vernon 08/15/2014,6:11 AM

## 2014-08-15 NOTE — ED Notes (Signed)
White necklace with elephant charm labeled in specimen cup and at bedside.

## 2014-08-15 NOTE — ED Notes (Signed)
Anesthesia arrived at bedside Early Osmond(Beth Eargle, CRNA)

## 2014-08-15 NOTE — ED Notes (Signed)
Anesthesia called to bedside for airway assistance.

## 2014-08-15 NOTE — H&P (Addendum)
PULMONARY / CRITICAL CARE MEDICINE HISTORY AND PHYSICAL EXAMINATION   Name: Rachael [name] MRN: [id number] DOB: 07/31/1991    ADMISSION DATE:  08/15/2014  PRIMARY SERVICE: PCCM  CHIEF COMPLAINT:  Altered Mental status  BRIEF PATIENT DESCRIPTION: 23 y/o woman with no PMHx presenting w/ intoxication and aspiration  SIGNIFICANT EVENTS / STUDIES:  Prolonged intubation w/ aspiration in ED  LINES / TUBES: 6.5 mm ETT - 08/15/14 PIVs x2 - 08/15/14 NGT - 08/15/14 Foley - 08/15/14  CULTURES: Blood x2 - 08/15/14  ANTIBIOTICS: Vanc 08/15/14 -  Zoysn 08/15/14 -  HISTORY OF PRESENT ILLNESS:   23 y/o woman with no PMHx who presents w/ alcohol intoxication, brought in by friends. Sound like drank about a fifth of liquor, and is a rare drinker. She became less responsive and began vomiting. She was brought to the ED by her friends, where she did not arouse to deep sternal rub and was intubated for airway protection. This failed several times before anesthesia was able to secure an endotracheal tube. There does sound like there some more vomiting with aspiration at this time. Once an airway was secured, PCCM was called for admission.  PAST MEDICAL HISTORY :  Past Medical History  Diagnosis Date  . Heart murmur    History reviewed. No pertinent past surgical history. Prior to Admission medications   Not on File   No Known Allergies  FAMILY HISTORY:  Family History  Problem Relation Age of Onset  . Heart attack Father     Sounds like arrythemia; died in 4630s   SOCIAL HISTORY:  reports that she drinks alcohol. Her tobacco and drug histories are not on file.  REVIEW OF SYSTEMS:  Unable to obtain 2/2 AMS  SUBJECTIVE:   VITAL SIGNS: Temp:  [95 F (35 C)-95.4 F (35.2 C)] 95.4 F (35.2 C) (12/12 0445) Pulse Rate:  [99-133] 133 (12/12 0445) Resp:  [14-28] 26 (12/12 0445) BP: (97-141)/(44-84) 109/53 mmHg (12/12 0403) SpO2:  [89 %-100 %] 100 % (12/12 0445) FiO2 (%):  [80 %-100 %]  80 % (12/12 0445) HEMODYNAMICS:   VENTILATOR SETTINGS: Vent Mode:  [-] PRVC FiO2 (%):  [80 %-100 %] 80 % Set Rate:  [16 bmp-24 bmp] 24 bmp Vt Set:  [420 mL] 420 mL PEEP:  [5 cmH20] 5 cmH20 Plateau Pressure:  [18 cmH20-22 cmH20] 18 cmH20 INTAKE / OUTPUT: Intake/Output      12/11 0701 - 12/12 0700   Urine 50   Total Output 50   Net -50         PHYSICAL EXAMINATION: General:  Unresponsive woman in NAD Neuro:  Sluggish CN reflexes, no spontaneous movement HEENT:  MMM, broken front incisor. Neck: No JVD Cardiovascular:  Heart sounds dual and normal Lungs:  Rhonchorous throughout Abdomen:  Scaphoid Musculoskeletal:  No deformities Skin:  Minor abbrasion noted on   LABS:  CBC  Recent Labs Lab 08/15/14 0224  WBC 8.7  HGB 12.3  HCT 37.8  PLT 204   Coag's No results for input(s): APTT, INR in the last 168 hours. BMET  Recent Labs Lab 08/15/14 0224  NA 143  K 3.7  CL 104  CO2 19  BUN 11  CREATININE 0.73  GLUCOSE 107*   Electrolytes  Recent Labs Lab 08/15/14 0224  CALCIUM 9.1   Sepsis Markers  Recent Labs Lab 08/15/14 0233  LATICACIDVEN 2.87*   ABG  Recent Labs Lab 08/15/14 0415  PHART 7.300*  PCO2ART 34.0*  PO2ART 226.0*   Liver Enzymes  Recent  Labs Lab 08/15/14 0224  AST 22  ALT 14  ALKPHOS 59  BILITOT <0.2*  ALBUMIN 4.5   Cardiac Enzymes No results for input(s): TROPONINI, PROBNP in the last 168 hours. Glucose  Recent Labs Lab 08/15/14 0158  GLUCAP 107*    Imaging Ct Head Wo Contrast  08/15/2014   CLINICAL DATA:  Patient drank a pt of liquor and probable forward, falling. Patient has abrasions to the left knee with left frontal 2 chipped. Minimally responsive. Altered mental status.  EXAM: CT HEAD WITHOUT CONTRAST  CT CERVICAL SPINE WITHOUT CONTRAST  TECHNIQUE: Multidetector CT imaging of the head and cervical spine was performed following the standard protocol without intravenous contrast. Multiplanar CT image  reconstructions of the cervical spine were also generated.  COMPARISON:  None.  FINDINGS: CT HEAD FINDINGS  Ventricles and sulci appear symmetrical. No mass effect or midline shift. No abnormal extra-axial fluid collections. Gray-white matter junctions are distinct. Basal cisterns are not effaced. No evidence of acute intracranial hemorrhage. No depressed skull fractures. Air-fluid levels are demonstrated in both maxillary antra. This could indicate pre-existing sinusitis or may be due to trauma. No visualized facial fractures identified within the field of view. Mastoid air cells are not opacified.  CT CERVICAL SPINE FINDINGS  Reversal of the usual cervical lordosis. This may be due to patient positioning but ligamentous injury or muscle spasm could also have this appearance and is not excluded. No anterior subluxation. Normal alignment of the facet joints. C1-2 articulation appears intact. No prevertebral soft tissue swelling. Endotracheal tube is visualized. No vertebral compression deformities. No focal bone lesion or bone destruction. Bone cortex and trabecular architecture appear intact. Soft tissues are unremarkable. There is opacification of the left lung apex suggesting a large pleural effusion or consolidation.  IMPRESSION: No acute intracranial abnormalities. Nonspecific air-fluid levels in the maxillary antra.  Nonspecific reversal of the usual cervical lordosis. No displaced fractures identified. Opacification of the visualized left apex which may indicate large effusion or consolidation.   Electronically Signed   By: Burman Nieves M.D.   On: 08/15/2014 03:30   Ct Cervical Spine Wo Contrast  08/15/2014   CLINICAL DATA:  Patient drank a pt of liquor and probable forward, falling. Patient has abrasions to the left knee with left frontal 2 chipped. Minimally responsive. Altered mental status.  EXAM: CT HEAD WITHOUT CONTRAST  CT CERVICAL SPINE WITHOUT CONTRAST  TECHNIQUE: Multidetector CT imaging of  the head and cervical spine was performed following the standard protocol without intravenous contrast. Multiplanar CT image reconstructions of the cervical spine were also generated.  COMPARISON:  None.  FINDINGS: CT HEAD FINDINGS  Ventricles and sulci appear symmetrical. No mass effect or midline shift. No abnormal extra-axial fluid collections. Gray-white matter junctions are distinct. Basal cisterns are not effaced. No evidence of acute intracranial hemorrhage. No depressed skull fractures. Air-fluid levels are demonstrated in both maxillary antra. This could indicate pre-existing sinusitis or may be due to trauma. No visualized facial fractures identified within the field of view. Mastoid air cells are not opacified.  CT CERVICAL SPINE FINDINGS  Reversal of the usual cervical lordosis. This may be due to patient positioning but ligamentous injury or muscle spasm could also have this appearance and is not excluded. No anterior subluxation. Normal alignment of the facet joints. C1-2 articulation appears intact. No prevertebral soft tissue swelling. Endotracheal tube is visualized. No vertebral compression deformities. No focal bone lesion or bone destruction. Bone cortex and trabecular architecture appear intact. Soft tissues  are unremarkable. There is opacification of the left lung apex suggesting a large pleural effusion or consolidation.  IMPRESSION: No acute intracranial abnormalities. Nonspecific air-fluid levels in the maxillary antra.  Nonspecific reversal of the usual cervical lordosis. No displaced fractures identified. Opacification of the visualized left apex which may indicate large effusion or consolidation.   Electronically Signed   By: Burman NievesWilliam  Stevens M.D.   On: 08/15/2014 03:30   Dg Chest Port 1 View  08/15/2014   CLINICAL DATA:  Found down, intoxication. Minimally responsive to sternal rubs.  EXAM: PORTABLE CHEST - 1 VIEW  COMPARISON:  None.  FINDINGS: Endotracheal tube tip projects 3.5 cm  above the carina. No pneumothorax.  Dense consolidation RIGHT upper lobe, with volume loss and patchy RIGHT mid and lower lung zone airspace opacities. Tapering in the RIGHT upper lobe bronchus. No pleural effusions. Cardiac silhouette appears normal in size.  Gas distended stomach.  Osseous structures unremarkable.  IMPRESSION: Endotracheal tube tip projects 3.5 cm above the carina.  Dense consolidation RIGHT upper lobe with apparent tapering of the RIGHT upper lobe bronchus concerning for aspiration, possible endo bronchial lesion/mass. Patchy airspace opacity in the RIGHT lung may be infectious or inflammatory.   Electronically Signed   By: Awilda Metroourtnay  Bloomer   On: 08/15/2014 03:55    EKG: sinus tachycardia. Mild J-point elevation, nonspecific. CXR: ETT in good position, pachy infiltrate on R, complete collapse of RUL.  ASSESSMENT / PLAN:  Active Problems:   Intoxication   PULMONARY A: Aspiration pneumonitis Respiratory failure RUL collapse Broken incisior P:   Good oxygen exchange on PRVC, maintain current settings, adjust per ABGs. Increase RR to 24 given acidosis. Will treat aspiration w/ vanc and zoysn. Consider bronchoscopy if collapse does not resolve. Re: broken incisor; may need CT of chest to ensure has not been aspirated, unless family member can produce broken tooth.  CARDIOVASCULAR A: No active issues P:   Fathers death of arrythmias in 30s and patient's report of "heart murmur" needs to be more fully explored if there is a heritable cause of sudden cardiac death.  RENAL A: Metabolic acidosis P:   Alcohol likely major driver, but elevated lactate may suggest additional metabolic disturbance. Will give IVF and reevaluate.  GASTROINTESTINAL A: Vomiting P:   Likely due to alcohol toxicity and prolonged bag-mask ventilation.  HEMATOLOGIC A: No active issues P:   ---  INFECTIOUS A: Aspiration event P:   Likely only pneumonitis, but will treat with ABX: vanc  / zoysn.  ENDOCRINE A: No active issues. P:     NEUROLOGIC A: Alcohol intoxication P:   Will await metabolism of alcohol; may give low dose propofol infusion as necessary to maintain RASS = 0.  BEST PRACTICE / DISPOSITION Level of Care:  ICU Primary Service:  PCCM Consultants:  None Code Status:  Full Diet:  NPO DVT Px:  Lovenox GI Px:  None Skin Integrity:  Intact (scrached knee) Social / Family:  Updated at admission.  TODAY'S SUMMARY: 23 y/o w/ alcohol intoxication and aspiration event.  I have personally obtained a history, examined the patient, evaluated laboratory and imaging results, formulated the assessment and plan and placed orders.  CRITICAL CARE: The patient is critically ill with multiple organ systems failure and requires high complexity decision making for assessment and support, frequent evaluation and titration of therapies, application of advanced monitoring technologies and extensive interpretation of multiple databases. Critical Care Time devoted to patient care services described in this note is 100 minutes.  Jamie Kato, MD Pulmonary and Critical Care Medicine [facility] Pager: [phone]   08/15/2014, 5:01 AM

## 2014-08-15 NOTE — ED Notes (Signed)
Dr Sherrian DiversBruce Denenny (anesthia) at bedside. Flexible videoscope at bedside if needed.

## 2014-08-15 NOTE — Anesthesia Procedure Notes (Signed)
Procedure Name: Intubation Date/Time: 08/15/2014 3:10 AM Performed by: Early OsmondEARGLE, Donnika Kucher E Pre-anesthesia Checklist: Patient identified, Emergency Drugs available, Suction available and Patient being monitored Patient Re-evaluated:Patient Re-evaluated prior to inductionOxygen Delivery Method: Ambu bag Preoxygenation: Pre-oxygenation with 100% oxygen Ventilation: Mask ventilation without difficulty and Mask ventilation throughout procedure Grade View: Grade II Tube type: Oral Tube size: 6.5 mm Number of attempts: 5 or more Airway Equipment and Method: Bougie stylet and Video-laryngoscopy Placement Confirmation: ETT inserted through vocal cords under direct vision,  positive ETCO2,  CO2 detector and breath sounds checked- equal and bilateral Secured at: 22 cm Dental Injury: Teeth and Oropharynx as per pre-operative assessment and Bloody posterior oropharynx  Difficulty Due To: Difficulty was anticipated, Difficult Airway- due to anterior larynx and Difficult Airway- due to cervical collar Future Recommendations: Recommend- induction with short-acting agent, and alternative techniques readily available Comments: Called to ED to assist with intubation after multiple attempts by ED physician. Pt unresponsive-had received 100mg  Rocuronium by ED physician, attempts with c-collar removed by ED staff prior to anesthesia arrival. CRNA [facility], and Glidescope, unable to pass ETT with stylet or Bougie. Able to mask ventilate throughout procedure. 10 mg Decadron and .2mg  Robinol given IV. Dr. Council Mechanicenenny called, arrived in ED. Glidescope used, with bougie stylet thru cords, some difficulty passing ETT, c-collar removed, neck stabilization maintained, ETT passed. Placement verified as above. ETT suctioned, some bloody secretions noted. VSS, O2 sats 100%.

## 2014-08-15 NOTE — ED Notes (Signed)
Per EMS pt found transported from home of friend, drank @ 1 pint of liquor, pt "crumpled" per friends falling forward, pt found arrived with abrasion to L knee, L front tooth noted to be chipped, nasal airway present R nare.  Pt minimally responsive to deep sternal rub on arrival.

## 2014-08-15 NOTE — ED Notes (Signed)
Bed: RESB Expected date:  Expected time:  Means of arrival:  Comments: EMS 

## 2014-08-15 NOTE — ED Notes (Signed)
Patient intubated, + color change, air noted to abd. Airway removed.

## 2014-08-16 ENCOUNTER — Encounter (HOSPITAL_COMMUNITY): Payer: Self-pay | Admitting: *Deleted

## 2014-08-16 DIAGNOSIS — R40242 Glasgow coma scale score 9-12: Secondary | ICD-10-CM

## 2014-08-16 DIAGNOSIS — R55 Syncope and collapse: Secondary | ICD-10-CM | POA: Insufficient documentation

## 2014-08-16 DIAGNOSIS — R4182 Altered mental status, unspecified: Secondary | ICD-10-CM | POA: Insufficient documentation

## 2014-08-16 DIAGNOSIS — R69 Illness, unspecified: Secondary | ICD-10-CM

## 2014-08-16 LAB — CBC
HCT: 34.4 % — ABNORMAL LOW (ref 36.0–46.0)
HEMOGLOBIN: 11.4 g/dL — AB (ref 12.0–15.0)
MCH: 28.2 pg (ref 26.0–34.0)
MCHC: 33.1 g/dL (ref 30.0–36.0)
MCV: 85.1 fL (ref 78.0–100.0)
PLATELETS: 177 10*3/uL (ref 150–400)
RBC: 4.04 MIL/uL (ref 3.87–5.11)
RDW: 13.6 % (ref 11.5–15.5)
WBC: 13.5 10*3/uL — AB (ref 4.0–10.5)

## 2014-08-16 LAB — LACTIC ACID, PLASMA: Lactic Acid, Venous: 0.7 mmol/L (ref 0.5–2.2)

## 2014-08-16 MED ORDER — FENTANYL CITRATE 0.05 MG/ML IJ SOLN: 25.0000 ug | INTRAMUSCULAR | Status: DC | PRN

## 2014-08-16 MED ORDER — SODIUM CHLORIDE 0.45 % IV SOLN
INTRAVENOUS | Status: DC
  Administered 2014-08-16: 11:00:00 via INTRAVENOUS

## 2014-08-16 MED ORDER — DEXAMETHASONE SODIUM PHOSPHATE 4 MG/ML IJ SOLN
10.0000 mg | Freq: Once | INTRAMUSCULAR | Status: DC
  Filled 2014-08-16: qty 2.5

## 2014-08-16 MED ORDER — DEXAMETHASONE SODIUM PHOSPHATE 10 MG/ML IJ SOLN
10.0000 mg | Freq: Once | INTRAMUSCULAR | Status: AC
  Administered 2014-08-16: 10 mg via INTRAVENOUS
  Filled 2014-08-16: qty 1

## 2014-08-16 NOTE — Procedures (Signed)
Extubation Procedure Note  Patient Details:   Name: Rachael [name] DOB: 04/14/1991 MRN: [id number]   Airway Documentation:     Evaluation  O2 sats: stable throughout Complications: No apparent complications Patient did tolerate procedure well. Bilateral Breath Sounds: Clear Suctioning: Airway Yes  Patient extubated to 2lnc. 02sats were 100% so 02 was stopped. Patient currently on room air. No complications. Patient tolerated well. Vital signs stable at this time. Family and RN at bedside. RT will continue to monitor.  Rachael [name], Rachael [name] 08/16/2014, 12:31 PM

## 2014-08-16 NOTE — Progress Notes (Signed)
PULMONARY / CRITICAL CARE MEDICINE    Name: Rachael [name] MRN: [id number] DOB: 20-Jul-1991    ADMISSION DATE:  08/15/2014  PRIMARY SERVICE: PCCM  CHIEF COMPLAINT:  Altered Mental status  BRIEF PATIENT DESCRIPTION: 23 y/o woman with no PMHx presenting w/ intoxication and aspiration  SIGNIFICANT EVENTS / STUDIES:  Prolonged intubation w/ aspiration in ED Bronch 12/12-RUL open   SUBJECTIVE:  Feeling better.  Wants ETT out.  Tol PS 8/5.   VITAL SIGNS: Temp:  [100.2 F (37.9 C)-102 F (38.9 C)] 100.8 F (38.2 C) (12/13 1000) Pulse Rate:  [60-124] 100 (12/13 1000) Resp:  [12-28] 19 (12/13 1000) BP: (108-121)/(51-70) 121/68 mmHg (12/13 1000) SpO2:  [99 %-100 %] 100 % (12/13 1000) FiO2 (%):  [40 %] 40 % (12/13 0742) Weight:  [127 lb 13.9 oz (58 kg)] 127 lb 13.9 oz (58 kg) (12/13 0500) HEMODYNAMICS:   VENTILATOR SETTINGS: Vent Mode:  [-] CPAP;PSV FiO2 (%):  [40 %] 40 % Set Rate:  [24 bmp] 24 bmp Vt Set:  [420 mL] 420 mL PEEP:  [5 cmH20] 5 cmH20 Pressure Support:  [8 cmH20] 8 cmH20 Plateau Pressure:  [14 cmH20-18 cmH20] 15 cmH20 INTAKE / OUTPUT: Intake/Output      12/12 0701 - 12/13 0700 12/13 0701 - 12/14 0700   I.V. (mL/kg) 370 (6.4) 20 (0.3)   NG/GT 150    IV Piggyback 856.5 12.5   Total Intake(mL/kg) 1376.5 (23.7) 32.5 (0.6)   Urine (mL/kg/hr) 2040 (1.5)    Total Output 2040     Net -663.5 +32.5          PHYSICAL EXAMINATION: General: young female, NAD  Neuro:  Awake, alert, follows commands, writes notes on vent  HEENT:  MMM, broken front incisor, ETT  Neck: No JVD Cardiovascular:  s1s2 rrr  Lungs:  Clear, resps even non labored on PS 8/5, RUL open Abdomen:  Scaphoid Musculoskeletal:  No deformities, no edema   LABS:  CBC  Recent Labs Lab 08/15/14 0224 08/15/14 0730  WBC 8.7 11.2*  HGB 12.3 12.4  HCT 37.8 37.1  PLT 204 175   Coag's No results for input(s): APTT, INR in the last 168 hours. BMET  Recent Labs Lab 08/15/14 0224 08/15/14 0730   NA 143 142  K 3.7 4.0  CL 104 108  CO2 19 15*  BUN 11 8  CREATININE 0.73 0.66  GLUCOSE 107* 107*   Electrolytes  Recent Labs Lab 08/15/14 0224 08/15/14 0730  CALCIUM 9.1 8.7  MG  --  1.7  PHOS  --  1.7*   Sepsis Markers  Recent Labs Lab 08/15/14 0233 08/15/14 1355  LATICACIDVEN 2.87* 4.0*   ABG  Recent Labs Lab 08/15/14 0415  PHART 7.300*  PCO2ART 34.0*  PO2ART 226.0*   Liver Enzymes  Recent Labs Lab 08/15/14 0224  AST 22  ALT 14  ALKPHOS 59  BILITOT <0.2*  ALBUMIN 4.5   Cardiac Enzymes No results for input(s): TROPONINI, PROBNP in the last 168 hours. Glucose  Recent Labs Lab 08/15/14 0158 08/15/14 0547  GLUCAP 107* 137*    Imaging Ct Head Wo Contrast  08/15/2014   CLINICAL DATA:  Patient drank a pt of liquor and probable forward, falling. Patient has abrasions to the left knee with left frontal 2 chipped. Minimally responsive. Altered mental status.  EXAM: CT HEAD WITHOUT CONTRAST  CT CERVICAL SPINE WITHOUT CONTRAST  TECHNIQUE: Multidetector CT imaging of the head and cervical spine was performed following the standard protocol without intravenous contrast. Multiplanar  CT image reconstructions of the cervical spine were also generated.  COMPARISON:  None.  FINDINGS: CT HEAD FINDINGS  Ventricles and sulci appear symmetrical. No mass effect or midline shift. No abnormal extra-axial fluid collections. Gray-white matter junctions are distinct. Basal cisterns are not effaced. No evidence of acute intracranial hemorrhage. No depressed skull fractures. Air-fluid levels are demonstrated in both maxillary antra. This could indicate pre-existing sinusitis or may be due to trauma. No visualized facial fractures identified within the field of view. Mastoid air cells are not opacified.  CT CERVICAL SPINE FINDINGS  Reversal of the usual cervical lordosis. This may be due to patient positioning but ligamentous injury or muscle spasm could also have this appearance and  is not excluded. No anterior subluxation. Normal alignment of the facet joints. C1-2 articulation appears intact. No prevertebral soft tissue swelling. Endotracheal tube is visualized. No vertebral compression deformities. No focal bone lesion or bone destruction. Bone cortex and trabecular architecture appear intact. Soft tissues are unremarkable. There is opacification of the left lung apex suggesting a large pleural effusion or consolidation.  IMPRESSION: No acute intracranial abnormalities. Nonspecific air-fluid levels in the maxillary antra.  Nonspecific reversal of the usual cervical lordosis. No displaced fractures identified. Opacification of the visualized left apex which may indicate large effusion or consolidation.   Electronically Signed   By: Burman Nieves M.D.   On: 08/15/2014 03:30   Ct Cervical Spine Wo Contrast  08/15/2014   CLINICAL DATA:  Patient drank a pt of liquor and probable forward, falling. Patient has abrasions to the left knee with left frontal 2 chipped. Minimally responsive. Altered mental status.  EXAM: CT HEAD WITHOUT CONTRAST  CT CERVICAL SPINE WITHOUT CONTRAST  TECHNIQUE: Multidetector CT imaging of the head and cervical spine was performed following the standard protocol without intravenous contrast. Multiplanar CT image reconstructions of the cervical spine were also generated.  COMPARISON:  None.  FINDINGS: CT HEAD FINDINGS  Ventricles and sulci appear symmetrical. No mass effect or midline shift. No abnormal extra-axial fluid collections. Gray-white matter junctions are distinct. Basal cisterns are not effaced. No evidence of acute intracranial hemorrhage. No depressed skull fractures. Air-fluid levels are demonstrated in both maxillary antra. This could indicate pre-existing sinusitis or may be due to trauma. No visualized facial fractures identified within the field of view. Mastoid air cells are not opacified.  CT CERVICAL SPINE FINDINGS  Reversal of the usual cervical  lordosis. This may be due to patient positioning but ligamentous injury or muscle spasm could also have this appearance and is not excluded. No anterior subluxation. Normal alignment of the facet joints. C1-2 articulation appears intact. No prevertebral soft tissue swelling. Endotracheal tube is visualized. No vertebral compression deformities. No focal bone lesion or bone destruction. Bone cortex and trabecular architecture appear intact. Soft tissues are unremarkable. There is opacification of the left lung apex suggesting a large pleural effusion or consolidation.  IMPRESSION: No acute intracranial abnormalities. Nonspecific air-fluid levels in the maxillary antra.  Nonspecific reversal of the usual cervical lordosis. No displaced fractures identified. Opacification of the visualized left apex which may indicate large effusion or consolidation.   Electronically Signed   By: Burman Nieves M.D.   On: 08/15/2014 03:30   Dg Chest Port 1 View  08/15/2014   CLINICAL DATA:  Status post bronchoscopy.  EXAM: PORTABLE CHEST - 1 VIEW  COMPARISON:  08/15/2014  FINDINGS: Endotracheal tube is in place with tip approximately 3.3 cm above carina. There has been significant improvement in  aeration of right upper lobe. Heart size is normal. Nasogastric tube is in place with side port and tip overlying the region of the stomach. There has been significant decompression of the stomach. No pulmonary edema. No pneumothorax.  IMPRESSION: Significant improvement in aeration of the right upper lobe following bronchoscopy.   Electronically Signed   By: Rosalie GumsBeth  Brown M.D.   On: 08/15/2014 16:50   Dg Chest Port 1 View  08/15/2014   CLINICAL DATA:  Found down, intoxication. Minimally responsive to sternal rubs.  EXAM: PORTABLE CHEST - 1 VIEW  COMPARISON:  None.  FINDINGS: Endotracheal tube tip projects 3.5 cm above the carina. No pneumothorax.  Dense consolidation RIGHT upper lobe, with volume loss and patchy RIGHT mid and lower  lung zone airspace opacities. Tapering in the RIGHT upper lobe bronchus. No pleural effusions. Cardiac silhouette appears normal in size.  Gas distended stomach.  Osseous structures unremarkable.  IMPRESSION: Endotracheal tube tip projects 3.5 cm above the carina.  Dense consolidation RIGHT upper lobe with apparent tapering of the RIGHT upper lobe bronchus concerning for aspiration, possible endo bronchial lesion/mass. Patchy airspace opacity in the RIGHT lung may be infectious or inflammatory.   Electronically Signed   By: Awilda Metroourtnay  Bloomer   On: 08/15/2014 03:55     ASSESSMENT / PLAN:  PULMONARY ETT (difficult intubation) 12/12>>> Aspiration pneumonitis Respiratory failure RUL collapse - s/p FOB 12/12 Broken incisior P:   Cont PS wean cpap 5 ps 5, goal 30 min  Mental status supports extubation Check cuff leak  If cuff leak ok, give decadron and extubate with glidescope, airway box at bedside  Will also have bronch available pulm hygiene  F/u CXR  rul open, no further bronch needed  CARDIOVASCULAR A: No active issues P:   Fathers death of arrythmias in 30s and patient's report of "heart murmur" needs to be more fully explored if there is a heritable cause of sudden cardiac death. Echo assessment- r/o hocm, assess murur tele  RENAL A: Metabolic acidosis - resolved  Nonag cl rising P:   F/u chem today for this residual acidosis  F/u lactate  Dc saline kvo 1/2 NS  GASTROINTESTINAL A: Vomiting - resolved.  P:   Advance diet slowly once extubated  If diet , dc ppi  HEMATOLOGIC A: No active issues P:   F/u cbc today lovenox   INFECTIOUS Aspiration event Fever  P:   BCx 2 12/12>>> BAL 12/12>>> vanc 12/12>>> Zosyn 12/12>>> Likely only pneumonitis, cont vanc/zosyn for now with persistent fever, likely narrow in am   ENDOCRINE A: No active issues. P:  cbg with chem  NEUROLOGIC A: Alcohol intoxication - resolved.  P:   WUA   Updated: pt, family  updated at length at bedside 12/13.   Dirk DressKaty Whiteheart, NP 08/16/2014  10:12 AM Pager: [phone]) [phone] or [phone]  STAFF NOTE: I, Rory Percyaniel Feinstein, MD FACP have personally reviewed patient's available data, including medical history, events of note, physical examination and test results as part of my evaluation. I have discussed with resident/NP and other care providers such as pharmacist, RN and RRT. In addition, I personally evaluated patient and elicited key findings of: WUA, change off saline, wean, cuff leak, abx maintain, awake, clear lungs now RUL, decadron x 1 The patient is critically ill with multiple organ systems failure and requires high complexity decision making for assessment and support, frequent evaluation and titration of therapies, application of advanced monitoring technologies and extensive interpretation of multiple databases.  Critical Care Time devoted to patient care services described in this note is30 Minutes. This time reflects time of care of this signee: Rory Percy, MD FACP. This critical care time does not reflect procedure time, or teaching time or supervisory time of PA/NP/Med student/Med Resident etc but could involve care discussion time. Rest per NP/medical resident whose note is outlined above and that I agree with   Mcarthur Rossetti. Tyson Alias, MD, FACP Pgr: [phone] [facility] 08/16/2014 10:41 AM

## 2014-08-16 NOTE — Progress Notes (Signed)
Unable to give flu shot d/t fever. Dr Tyson AliasFeinstein ordered to hold and give med prior to dc.

## 2014-08-17 ENCOUNTER — Encounter (HOSPITAL_COMMUNITY): Payer: Self-pay | Admitting: *Deleted

## 2014-08-17 DIAGNOSIS — R400 Somnolence: Secondary | ICD-10-CM

## 2014-08-17 MED ORDER — AMOXICILLIN-POT CLAVULANATE 875-125 MG PO TABS: 1.0000 | ORAL_TABLET | Freq: Two times a day (BID) | ORAL | Status: AC

## 2014-08-17 NOTE — Discharge Summary (Signed)
Physician Discharge Summary  Patient ID: Rachael [name] MRN: [id number] DOB/AGE: [birth year] 23 y.o.  Admit date: 08/15/2014 Discharge date: 08/17/2014    Discharge Diagnoses:  Active Problems:   Intoxication   Aspiration pneumonia   Altered mental status   Collapse    Brief Summary: Rachael [name] is a 23 y.o. y/o female with no past medical hx admitted 12/12 with AMS.  She is a rare drinker and was out drinking with friends.  She became less responsive, "passed out" and began vomiting.  She required intubation for airway protection.  This was a difficult airway r/t anterior airway and c-collar and anesthesia assistance was required. There was some emesis and obvious aspiration during multiple attempts at intubation.  She was stabilized and admitted to the ICU.  Placed on IV abx and underwent bronchoscopy with lavage and mucous plugs removed.  CXR much improved after bronchoscopy.  The patient weaned well.  After a cuff leak test was positive and 31m IV decadron given, she was extubated with glidescope and difficult airway cart at bedside. She is now awake, alert, great respiratory status on RA, ambulating without difficulty on the unit.  She is medically cleared and ready for d/c home. She will complete 7 days total abx with Augmentin as outpt.   Of note, on presentation to ER it was noted that her L front tooth was chipped.  Per friends this occurred when she was altered outside of the club.  She will need dental follow-up.   Consults: none  Lines/tubes: ETT 12/12>>>12/13   Microbiology/Sepsis markers: BCx2 12/12>>>ngtd>>>  Significant Diagnostic Studies:  CT head/c-spine 12/12>>>No acute intracranial abnormalities. Nonspecific air-fluid levels in the maxillary antra. Nonspecific reversal of the usual cervical lordosis. No displaced fractures identified. Opacification of the visualized left apex which may indicate large effusion or consolidation.                                                                      Hospital Summary by Discharge Diagnosis   Filed Vitals:   08/17/14 0700 08/17/14 0800 08/17/14 0851 08/17/14 0900  BP: 104/50 123/62  112/62  Pulse: 66 103  76  Temp:   98.6 F (37 C)   TempSrc:   Oral   Resp: _0 Height:      Weight:      SpO2: 100% 100%  100%     Discharge Labs  BMET  Recent Labs Lab 08/15/14 0224 08/15/14 0730  NA 143 142  K 3.7 4.0  CL 104 108  CO2 19 15*  GLUCOSE 107* 107*  BUN 11 8  CREATININE 0.73 0.66  CALCIUM 9.1 8.7  MG  --  1.7  PHOS  --  1.7*     CBC   Recent Labs Lab 08/15/14 0224 08/15/14 0730 08/16/14 1224  HGB 12.3 12.4 11.4*  HCT 37.8 37.1 34.4*  WBC 8.7 11.2* 13.5*  PLT 204 175 177   Anti-Coagulation No results for input(s): INR in the last 168 hours.        Medication List    TAKE these medications        amoxicillin-clavulanate 875-125 MG per tablet  Commonly known as:  AUGMENTIN  Take 1 tablet by mouth 2 (two)  times daily.          Disposition: Home   Discharged Condition: Rachael [name] has met maximum benefit of inpatient care and is medically stable and cleared for discharge.  Patient is pending follow up as above.      Time spent on disposition:  Greater than 35 minutes.   SignedDarlina Sicilian, NP 08/17/2014  9:23 AM Pager: [phone] or [phone]  *Care during the described time interval was provided by me and/or other providers on the critical care team. I have reviewed this patient's available data, including medical history, events of note, physical examination and test results as part of my evaluation.      As above agree Will make appt dentists Fully examined She had neg echo in sept, dc echo abx to finish course She walked unit , no desat STAFF NOTE: I, Merrie Roof, MD FACP have personally reviewed patient's available data, including medical history, events of note, physical examination and test results as part of  my evaluation. I have discussed with resident/NP and other care providers such as pharmacist, RN and RRT.  Lavon Paganini. Titus Mould, MD, [place] Pgr: [facility] 08/17/2014 2:23 PM   Lavon Paganini. Titus Mould, MD, [place] Pgr: [facility]]

## 2014-08-21 LAB — CULTURE, BLOOD (ROUTINE X 2): CULTURE: NO GROWTH

## 2015-10-07 IMAGING — CR DG CHEST 1V PORT
1 series · 1 of 1 positions shown · non-contrast
Comparison: 08/15/2014

CLINICAL DATA: Status post bronchoscopy.

EXAM:
PORTABLE CHEST - 1 VIEW

[AP]
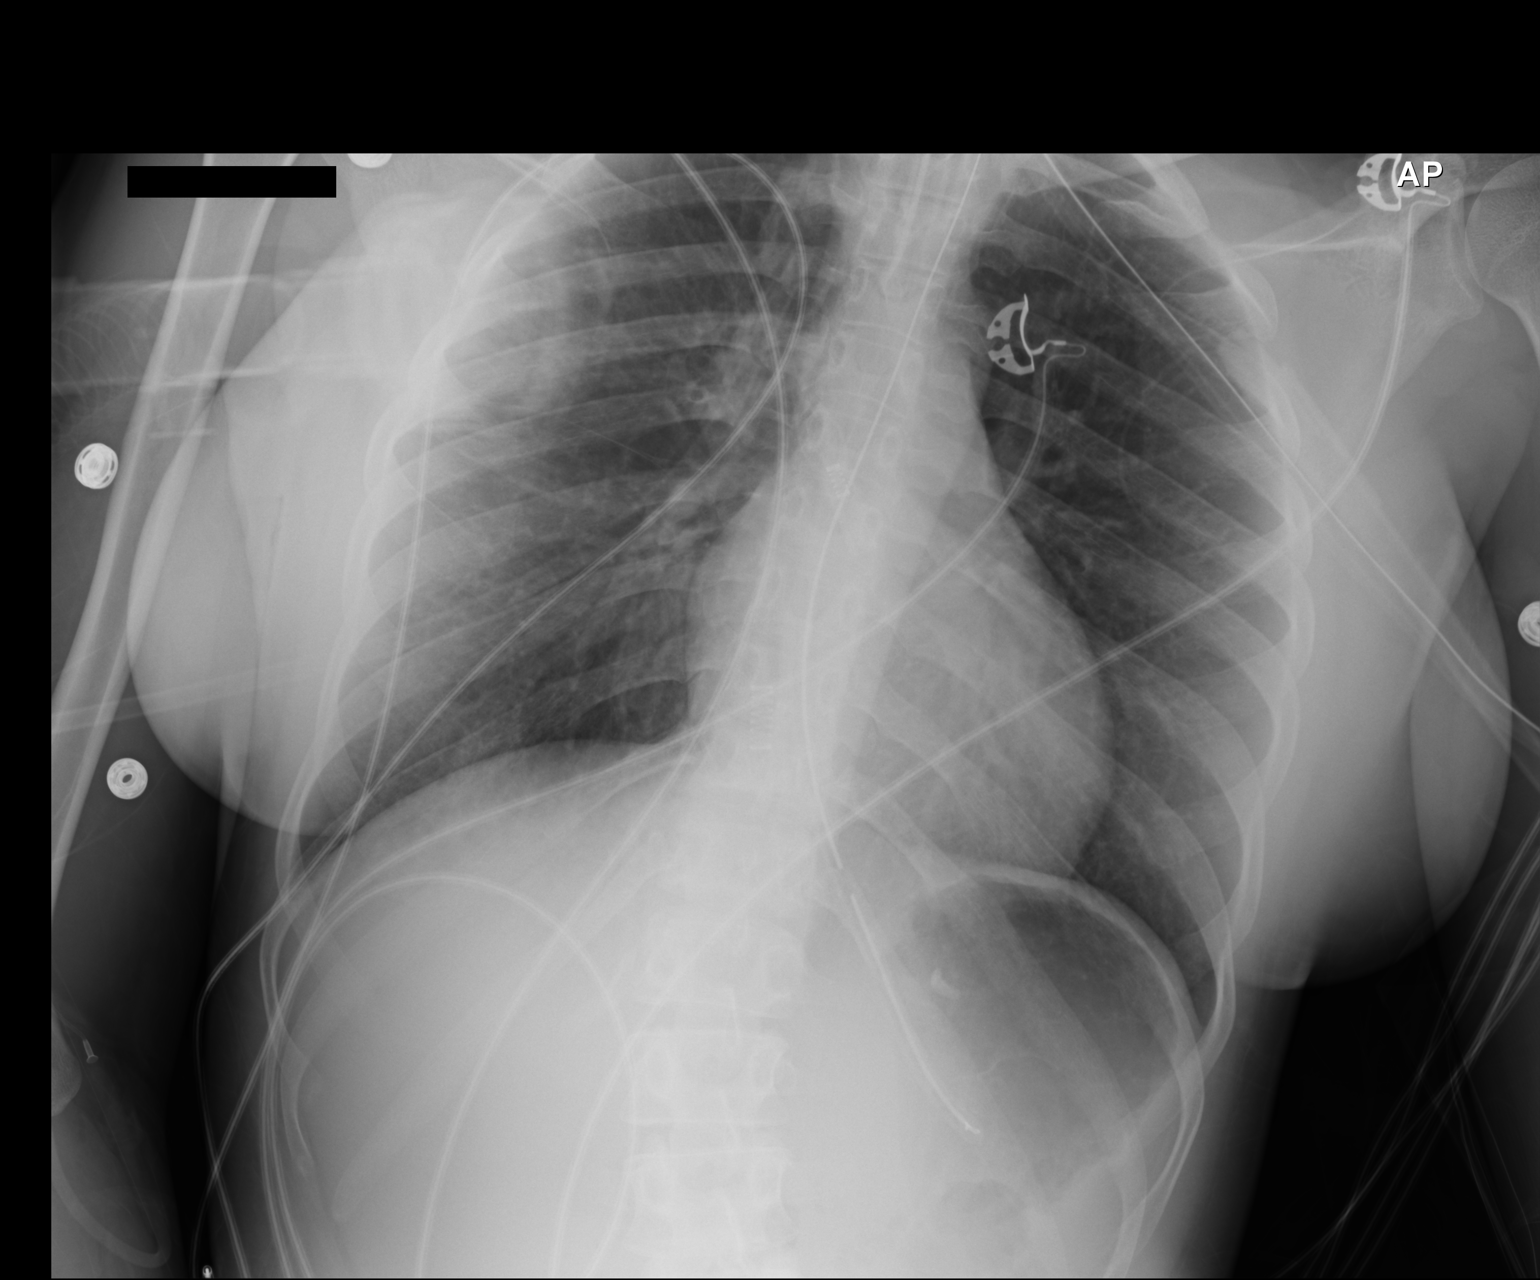

[1 of 1 positions shown; findings below may reference images not displayed]

FINDINGS: Endotracheal tube is in place with tip approximately 3.3 cm above
carina. There has been significant improvement in aeration of right
upper lobe. Heart size is normal. Nasogastric tube is in place with
side port and tip overlying the region of the stomach. There has
been significant decompression of the stomach. No pulmonary edema.
No pneumothorax.
IMPRESSION: Significant improvement in aeration of the right upper lobe
following bronchoscopy.

## 2015-10-07 IMAGING — CT CT HEAD W/O CM
3 series · 18 of 30 positions shown, 19 images · non-contrast
Comparison: None.

CLINICAL DATA: Patient drank a pt of liquor and probable forward,
falling. Patient has abrasions to the left knee with left frontal 2
chipped. Minimally responsive. Altered mental status.

EXAM:
CT HEAD WITHOUT CONTRAST
CT CERVICAL SPINE WITHOUT CONTRAST
TECHNIQUE: Multidetector CT imaging of the head and cervical spine was
performed following the standard protocol without intravenous
contrast. Multiplanar CT image reconstructions of the cervical spine
were also generated.

[Series 3: head w/o · axial · non-contrast · 0.43mm/px · z∈[-106,-36]mm · 3 of 28 slices shown, 4 images]
[im 7/28  brain]
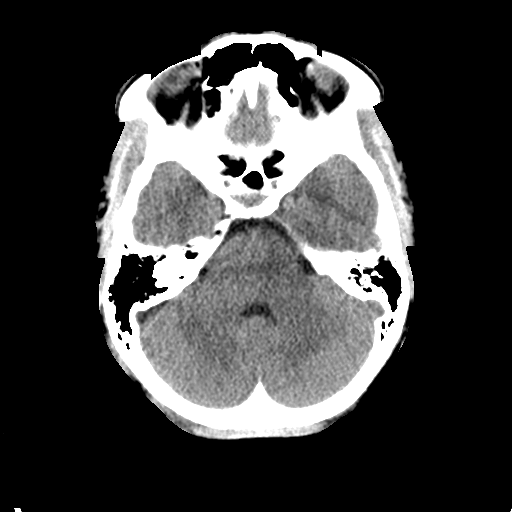
[im 7/28  bone]
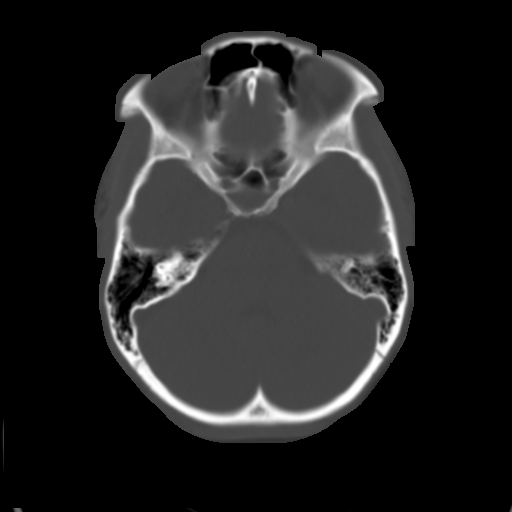
[im 14/28  brain]
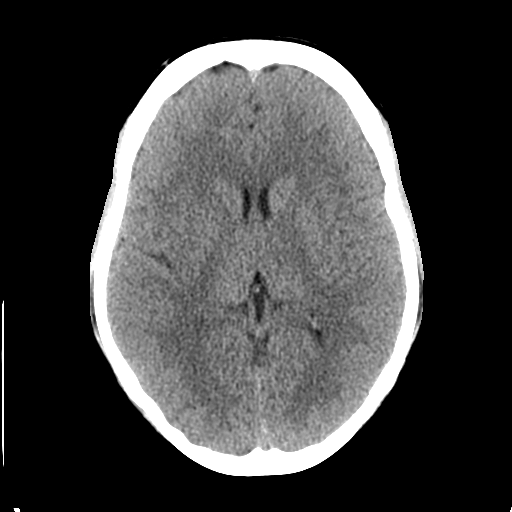
[im 21/28  brain]
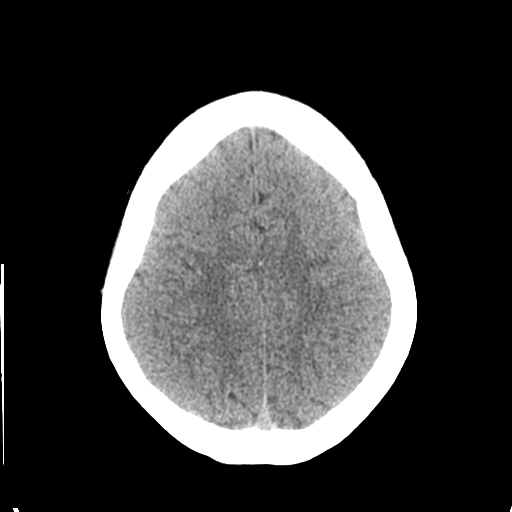

[Series 4: bone windows · axial · 0.43mm/px · z∈[-121,-16]mm · 8 of 47 slices shown]
[im 6/47  bone]
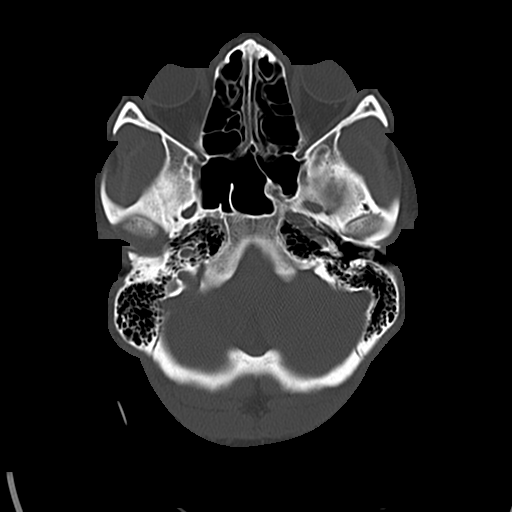
[im 11/47  bone]
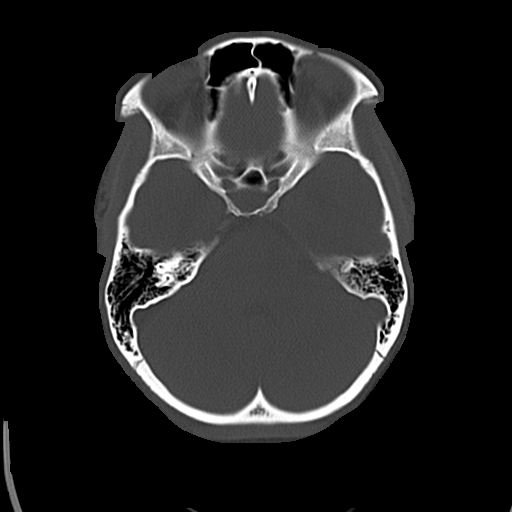
[im 16/47  bone]
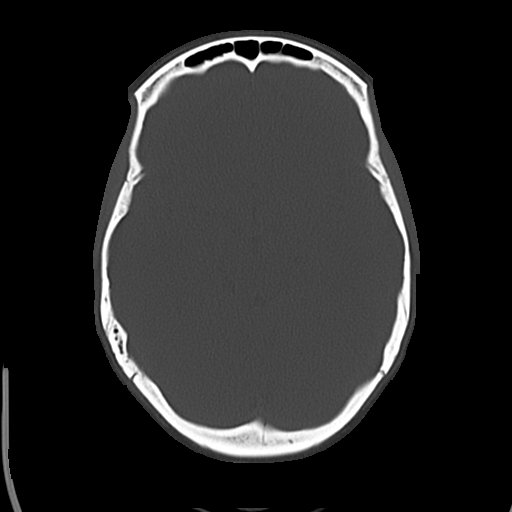
[im 21/47  bone]
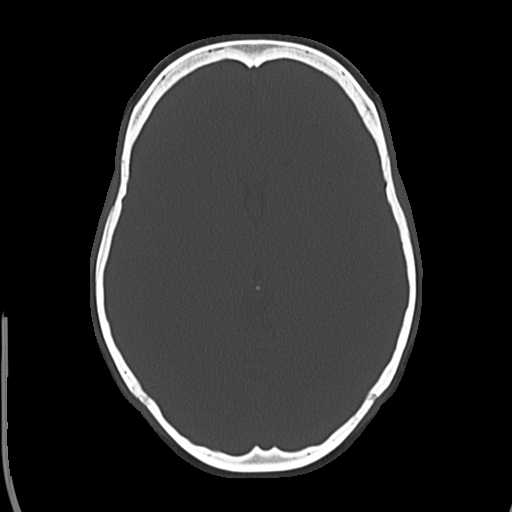
[im 26/47  bone]
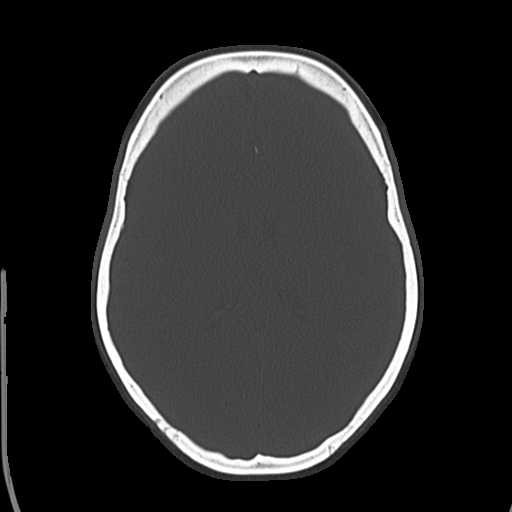
[im 31/47  bone]
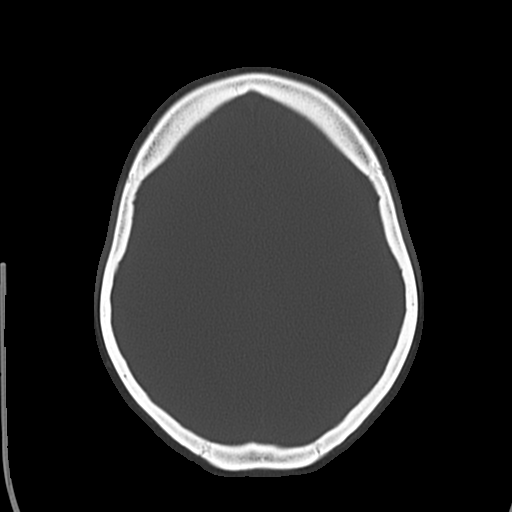
[im 36/47  bone]
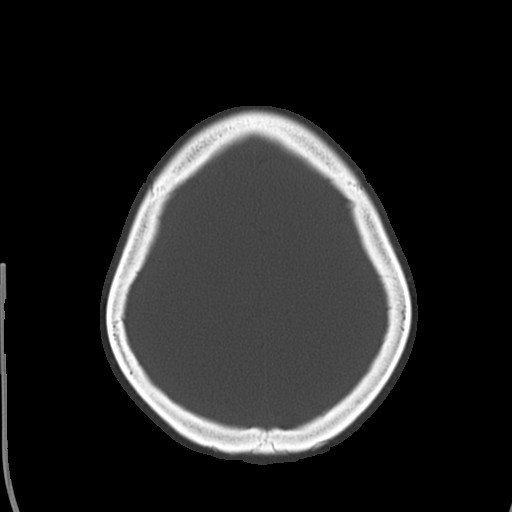
[im 41/47  bone]
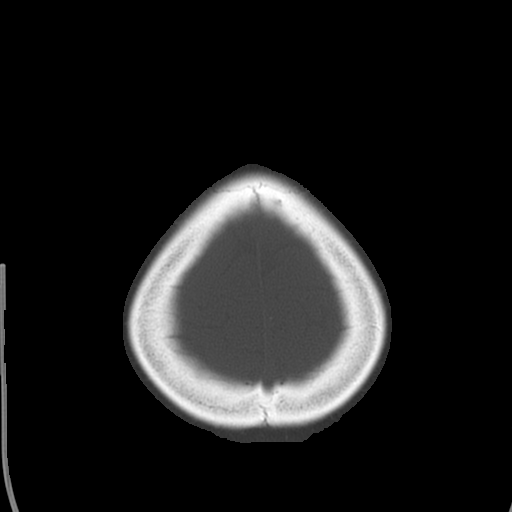

[Series 5: c-spine st · axial · 0.28mm/px · z∈[-280,-178]mm · 7 of 73 slices shown]
[im 6/73  brain]
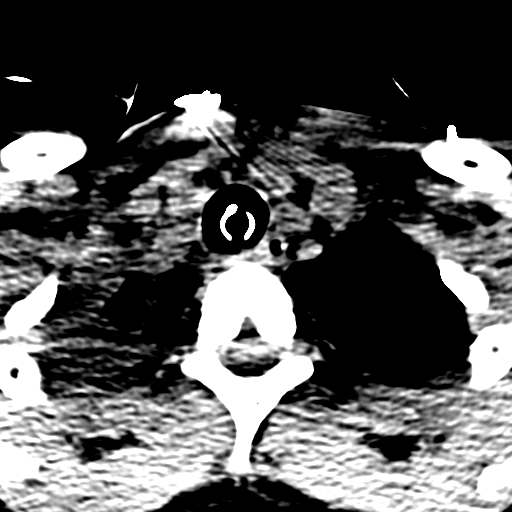
[im 16/73  brain]
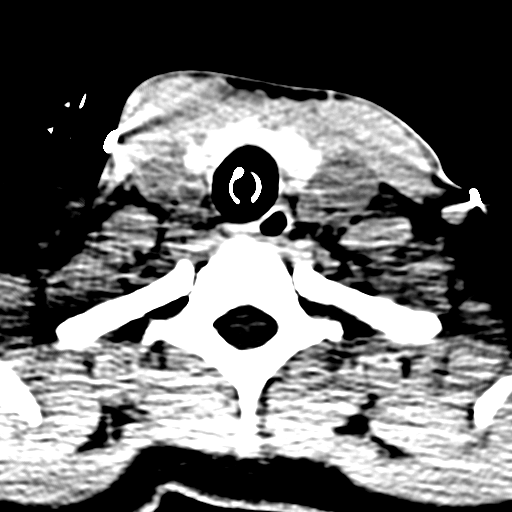
[im 26/73  brain]
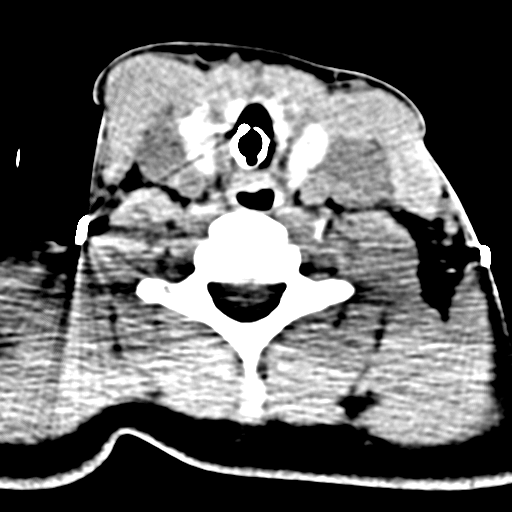
[im 31/73  brain]
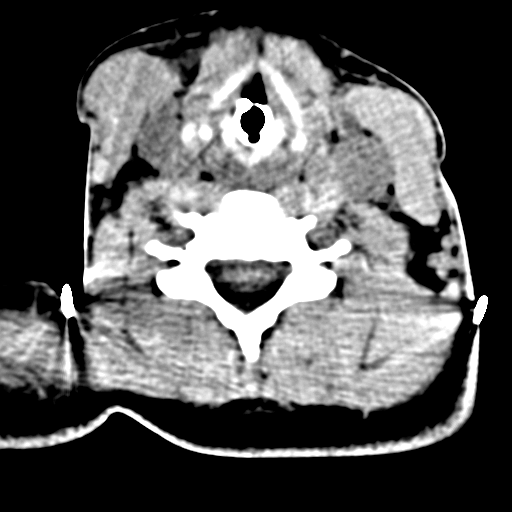
[im 42/73  brain]
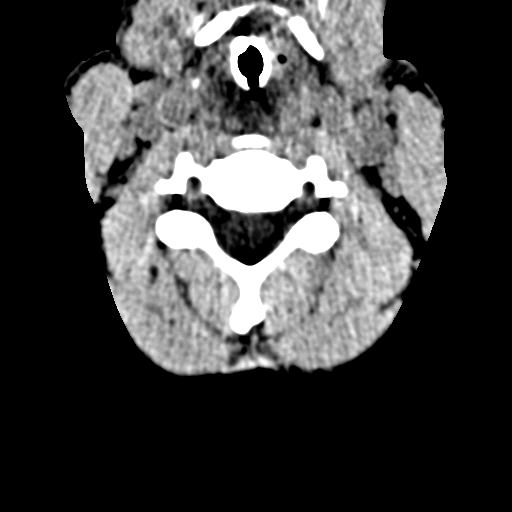
[im 47/73  brain]
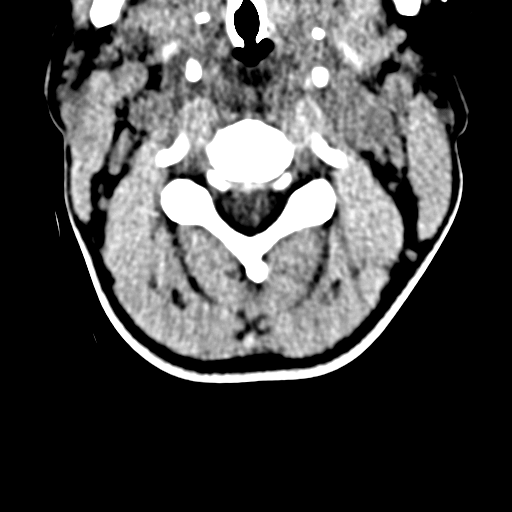
[im 57/73  brain]
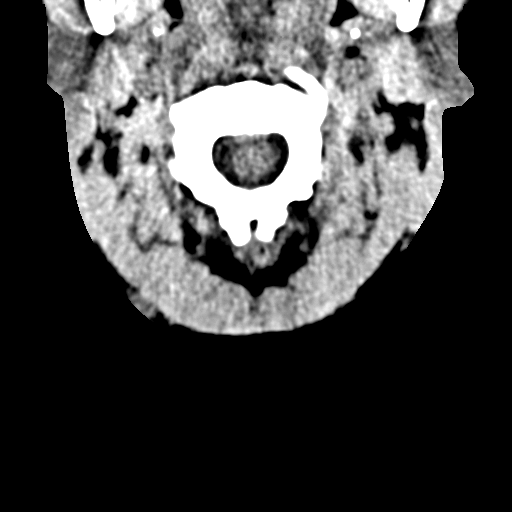

[18 of 30 positions shown; findings below may reference images not displayed]

FINDINGS: CT HEAD FINDINGS

Ventricles and sulci appear symmetrical. No mass effect or midline
shift. No abnormal extra-axial fluid collections. Gray-white matter
junctions are distinct. Basal cisterns are not effaced. No evidence
of acute intracranial hemorrhage. No depressed skull fractures.
Air-fluid levels are demonstrated in both maxillary antra. This
could indicate pre-existing sinusitis or may be due to trauma. No
visualized facial fractures identified within the field of view.
Mastoid air cells are not opacified.

CT CERVICAL SPINE FINDINGS

Reversal of the usual cervical lordosis. This may be due to patient
positioning but ligamentous injury or muscle spasm could also have
this appearance and is not excluded. No anterior subluxation. Normal
alignment of the facet joints. C1-2 articulation appears intact. No
prevertebral soft tissue swelling. Endotracheal tube is visualized.
No vertebral compression deformities. No focal bone lesion or bone
destruction. Bone cortex and trabecular architecture appear intact.
Soft tissues are unremarkable. There is opacification of the left
lung apex suggesting a large pleural effusion or consolidation.
IMPRESSION: No acute intracranial abnormalities. Nonspecific air-fluid levels in
the maxillary antra.

Nonspecific reversal of the usual cervical lordosis. No displaced
fractures identified. Opacification of the visualized left apex
which may indicate large effusion or consolidation.

## 2021-02-21 ENCOUNTER — Other Ambulatory Visit (HOSPITAL_COMMUNITY): Payer: Self-pay

## 2022-03-30 ENCOUNTER — Ambulatory Visit (INDEPENDENT_AMBULATORY_CARE_PROVIDER_SITE_OTHER): Payer: Self-pay

## 2022-03-30 ENCOUNTER — Ambulatory Visit (INDEPENDENT_AMBULATORY_CARE_PROVIDER_SITE_OTHER): Payer: Self-pay | Admitting: Obstetrics & Gynecology

## 2022-03-30 ENCOUNTER — Encounter (HOSPITAL_BASED_OUTPATIENT_CLINIC_OR_DEPARTMENT_OTHER): Payer: Self-pay | Admitting: Obstetrics & Gynecology

## 2022-03-30 VITALS — BP 122/58 | HR 70 | Ht 63.0 in

## 2022-03-30 DIAGNOSIS — Z3689 Encounter for other specified antenatal screening: Secondary | ICD-10-CM

## 2022-03-30 DIAGNOSIS — Z3201 Encounter for pregnancy test, result positive: Secondary | ICD-10-CM

## 2022-03-30 DIAGNOSIS — N926 Irregular menstruation, unspecified: Secondary | ICD-10-CM

## 2022-03-30 LAB — POCT URINE PREGNANCY: Preg Test, Ur: POSITIVE — AB

## 2022-03-31 LAB — BETA HCG QUANT (REF LAB): hCG Quant: 2690 m[IU]/mL

## 2022-04-02 ENCOUNTER — Encounter (HOSPITAL_BASED_OUTPATIENT_CLINIC_OR_DEPARTMENT_OTHER): Payer: Self-pay | Admitting: Obstetrics & Gynecology

## 2022-04-02 NOTE — Progress Notes (Signed)
GYNECOLOGY  VISIT  CC:   positive pregnancy testing  HPI: 31 y.o. [id number] Single Black or African American female here for new patient appt after having a positive pregnancy test at home this week. Has no idea about LMP as they are not regular.  Had nexplanon removed last June.  Had TAB in January, then SAB in May.  Hasn't really had much bleeding this year.  Currently, denies vaginal bleeding or pain.  Limited ultrasound performed.  Only gestational sac noted with transabdominal and transvaginal ultrasound.  D/w pt obtaining blood type and HCG and then repeating on Monday if level is appropriate with early pregnancy.  Pt comfortable with plan.   Past Medical History:  Diagnosis Date   Difficult intubation 08/15/14   Heart murmur    Nonsmoker     MEDS:   No current outpatient medications on file prior to visit.   No current facility-administered medications on file prior to visit.    ALLERGIES: Patient has no known allergies.  SH:  single, non smoker  Review of Systems  Constitutional: Negative.   Genitourinary: Negative.     PHYSICAL EXAMINATION:    BP (!) 122/58   Pulse 70   Ht 5\' 3"  (1.6 m)   BMI 22.69 kg/m     General appearance: alert, cooperative and appears stated age Abdomen: soft, non-tender; bowel sounds normal; no masses,  no organomegaly Lymph:  no inguinal LAD noted  Pelvic: External genitalia:  no lesions              Urethra:  normal appearing urethra with no masses, tenderness or lesions              Bartholins and Skenes: normal                 Vagina: normal appearing vagina with normal color and discharge, no lesions               Uterus:  non tender              Adnexa: no mass, fullness, tenderness  Chaperone, , RN, was present for exam.  Assessment/Plan: 1. Missed period - Beta hCG quant (ref lab).  If not in range where IUD should be seen, will repeat on Monday.  Then will consider when to repeat ultrasound. - POCT urine  pregnancy  2. Establish gestational age, ultrasound - Tuesday OB Comp Less 14 Wks; Future  3. Positive pregnancy test - POCT urine pregnancy

## 2022-04-03 ENCOUNTER — Other Ambulatory Visit (HOSPITAL_BASED_OUTPATIENT_CLINIC_OR_DEPARTMENT_OTHER): Payer: Self-pay

## 2022-04-03 DIAGNOSIS — Z3490 Encounter for supervision of normal pregnancy, unspecified, unspecified trimester: Secondary | ICD-10-CM

## 2022-04-03 DIAGNOSIS — N926 Irregular menstruation, unspecified: Secondary | ICD-10-CM

## 2022-04-04 LAB — BETA HCG QUANT (REF LAB): hCG Quant: 6162 m[IU]/mL

## 2022-04-05 ENCOUNTER — Other Ambulatory Visit (HOSPITAL_BASED_OUTPATIENT_CLINIC_OR_DEPARTMENT_OTHER): Payer: No Typology Code available for payment source

## 2022-04-05 DIAGNOSIS — Z3490 Encounter for supervision of normal pregnancy, unspecified, unspecified trimester: Secondary | ICD-10-CM

## 2022-04-05 NOTE — Addendum Note (Signed)
Addended by: Jerene Bears on: 04/05/2022 02:46 AM   Modules accepted: Orders

## 2022-04-06 ENCOUNTER — Telehealth (HOSPITAL_BASED_OUTPATIENT_CLINIC_OR_DEPARTMENT_OTHER): Payer: Self-pay | Admitting: *Deleted

## 2022-04-06 LAB — BETA HCG QUANT (REF LAB): hCG Quant: 13415 m[IU]/mL

## 2022-04-06 NOTE — Telephone Encounter (Signed)
LMOVM for pt to call for results

## 2022-04-06 NOTE — Telephone Encounter (Signed)
DOB verified. Informed pt that Hcg level had increased appropriately. Pt provided with dating u/s

## 2022-04-12 ENCOUNTER — Other Ambulatory Visit (HOSPITAL_COMMUNITY): Payer: Self-pay

## 2022-04-12 ENCOUNTER — Telehealth: Payer: No Typology Code available for payment source | Admitting: Physician Assistant

## 2022-04-12 DIAGNOSIS — H1032 Unspecified acute conjunctivitis, left eye: Secondary | ICD-10-CM | POA: Diagnosis not present

## 2022-04-12 MED ORDER — AZITHROMYCIN 1 % OP SOLN
OPHTHALMIC | 0 refills | Status: AC
  Filled 2022-04-12: qty 2.5, 7d supply, fill #0
  Filled 2022-04-12: qty 2.5, 5d supply, fill #0

## 2022-04-12 NOTE — Patient Instructions (Signed)
  Sherrilyn Rist, thank you for joining Piedad Climes, PA-C for today's virtual visit.  While this provider is not your primary care provider (PCP), if your PCP is located in our provider database this encounter information [name] be shared with them immediately following your visit.  Consent: (Patient) Rachael [name] provided verbal consent for this virtual visit at the beginning of the encounter.  Current Medications: No current outpatient medications on file.   Medications ordered in this encounter:  No orders of the defined types were placed in this encounter.    *If you need refills on other medications prior to your next appointment, please contact your pharmacy*  Follow-Up: Call back or seek an in-person evaluation if the symptoms worsen or if the condition fails to improve as anticipated.  Other Instructions Please keep hands washed and avoid rubbing the eyes.  Keep contact lenses out until fully resolved; using a new pair when ready. Apply warm compresses to the eye for 10 minutes, a few times daily to promote drainage.  Use the antibiotic drops as directed. If not resolving, or you note any new/worsening symptoms despite treatment, please seek an in-person evaluation.   If you have been instructed to have an in-person evaluation today at a local Urgent Care facility, please use the link below. It [name] take you to a list of all of our available [facility], including address, phone number and hours of operation. Please do not delay care.  [facility]  If you or a family member do not have a primary care provider, use the link below to schedule a visit and establish care. When you choose a [facility] primary care physician or advanced practice provider, you gain a long-term partner in health. Find a Primary Care Provider  Learn more about [facility]'s in-office and virtual care options: [facility] - Get Care Now

## 2022-04-12 NOTE — Progress Notes (Signed)
Virtual Visit Consent   Rachael [name], you are scheduled for a virtual visit with a [facility] provider today. Just as with appointments in the office, your consent must be obtained to participate. Your consent will be active for this visit and any virtual visit you may have with one of our providers in the next 365 days. If you have a MyChart account, a copy of this consent can be sent to you electronically.  As this is a virtual visit, video technology does not allow for your provider to perform a traditional examination. This may limit your provider's ability to fully assess your condition. If your provider identifies any concerns that need to be evaluated in person or the need to arrange testing (such as labs, EKG, etc.), we will make arrangements to do so. Although advances in technology are sophisticated, we cannot ensure that it will always work on either your end or our end. If the connection with a video visit is poor, the visit may have to be switched to a telephone visit. With either a video or telephone visit, we are not always able to ensure that we have a secure connection.  By engaging in this virtual visit, you consent to the provision of healthcare and authorize for your insurance to be billed (if applicable) for the services provided during this visit. Depending on your insurance coverage, you may receive a charge related to this service.  I need to obtain your verbal consent now. Are you willing to proceed with your visit today? Rachael [name] has provided verbal consent on 04/12/2022 for a virtual visit (video or telephone). Piedad Climes, [place]  Date: 04/12/2022 12:10 PM  Virtual Visit via Video Note   I, Piedad Climes, connected with  Rachael [name]  ([id number], 1990/11/25) on 04/12/22 at 12:00 PM EDT by a video-enabled telemedicine application and verified that I am speaking with the correct person using two identifiers.  Location: Patient: Virtual Visit Location  Patient: Home Provider: Virtual Visit Location Provider: Home Office   I discussed the limitations of evaluation and management by telemedicine and the availability of in person appointments. The patient expressed understanding and agreed to proceed.    History of Present Illness: Rachael [name] is a 31 y.o. who identifies as a female who was assigned female at birth, and is being seen today for possible pink eye of L eye. Notes yesterday having the sensation of something in her eye, like a grit, with some mild light sensitivity. Denies any trauma or injury. Took contacts out last night. Today with increased redness, drainage from the eye. Denies fever, chills. Denies abrupt vision changes. Denies any trauma or injury or exposure to chemicals that she can recall. Denies known sick contact.Marland Kitchen  HPI: HPI  Problems:  Patient Active Problem List   Diagnosis Date Noted   Altered mental status    Collapse    Intoxication 08/15/2014    Allergies: No Known Allergies Medications:  Current Outpatient Medications:    azithromycin (AZASITE) 1 % ophthalmic solution, Apply 1 drop to the affected eye(s) twice daily x 1 day.  Then apply daily to affected eye(s) one daily x 4 days., Disp: 2.5 mL, Rfl: 0  Observations/Objective: Patient is well-developed, well-nourished in no acute distress.  Resting comfortably at home.  Head is normocephalic, atraumatic.  No labored breathing. Speech is clear and coherent with logical content.  Patient is alert and oriented at baseline.  Left conjunctiva with moderate injection noted. Some drainage noted around medial  canthus of L eye. No lid swelling appreciated. Right conjunctiva within normal limits. Pupils are equal and round. EOMI  Assessment and Plan: 1. Acute bacterial conjunctivitis of left eye - azithromycin (AZASITE) 1 % ophthalmic solution; Apply 1 drop to the affected eye(s) twice daily x 1 day.  Then apply daily to affected eye(s) one daily x 4 days.   Dispense: 2.5 mL; Refill: 0  Supportive measures reviewed. Keep contacts out until resolved. Then can use new pair of lenses. Start Azithromycin OP giving currently pregnant. Strict in-person precautions discussed.  Follow Up Instructions: I discussed the assessment and treatment plan with the patient. The patient was provided an opportunity to ask questions and all were answered. The patient agreed with the plan and demonstrated an understanding of the instructions.  A copy of instructions were sent to the patient via MyChart unless otherwise noted below.   The patient was advised to call back or seek an in-person evaluation if the symptoms worsen or if the condition fails to improve as anticipated.  Time:  I spent 8 minutes with the patient via telehealth technology discussing the above problems/concerns.    Piedad Climes, PA-C

## 2022-04-13 ENCOUNTER — Other Ambulatory Visit (HOSPITAL_COMMUNITY): Payer: Self-pay

## 2022-04-19 ENCOUNTER — Other Ambulatory Visit (HOSPITAL_BASED_OUTPATIENT_CLINIC_OR_DEPARTMENT_OTHER): Payer: Self-pay | Admitting: Obstetrics & Gynecology

## 2022-04-19 ENCOUNTER — Ambulatory Visit (INDEPENDENT_AMBULATORY_CARE_PROVIDER_SITE_OTHER): Payer: No Typology Code available for payment source

## 2022-04-19 DIAGNOSIS — Z3689 Encounter for other specified antenatal screening: Secondary | ICD-10-CM

## 2022-04-19 DIAGNOSIS — Z3A08 8 weeks gestation of pregnancy: Secondary | ICD-10-CM | POA: Diagnosis not present

## 2022-04-19 DIAGNOSIS — O3680X1 Pregnancy with inconclusive fetal viability, fetus 1: Secondary | ICD-10-CM | POA: Diagnosis not present

## 2022-04-25 ENCOUNTER — Ambulatory Visit (HOSPITAL_BASED_OUTPATIENT_CLINIC_OR_DEPARTMENT_OTHER): Payer: Self-pay | Admitting: Advanced Practice Midwife

## 2022-04-26 ENCOUNTER — Ambulatory Visit: Payer: No Typology Code available for payment source

## 2022-04-26 ENCOUNTER — Ambulatory Visit
Admission: RE | Admit: 2022-04-26 | Discharge: 2022-04-26 | Disposition: A | Discharge status: Home / Self Care | Payer: No Typology Code available for payment source | Source: Ambulatory Visit | Attending: Urgent Care | Admitting: Urgent Care

## 2022-04-26 VITALS — BP 124/66 | HR 81 | Temp 98.2°F | Resp 20

## 2022-04-26 DIAGNOSIS — Z3A09 9 weeks gestation of pregnancy: Secondary | ICD-10-CM | POA: Diagnosis present

## 2022-04-26 DIAGNOSIS — N949 Unspecified condition associated with female genital organs and menstrual cycle: Secondary | ICD-10-CM | POA: Insufficient documentation

## 2022-04-26 DIAGNOSIS — N898 Other specified noninflammatory disorders of vagina: Secondary | ICD-10-CM | POA: Insufficient documentation

## 2022-04-26 DIAGNOSIS — N761 Subacute and chronic vaginitis: Secondary | ICD-10-CM | POA: Diagnosis present

## 2022-04-26 NOTE — Discharge Instructions (Addendum)
We will let you know about your test results as soon as possible.

## 2022-04-26 NOTE — ED Triage Notes (Signed)
Pt here with bumps in vaginal area along with vaginal discharge and irritation. Pt is concerned about exposure to STI due to having a new partner. Pt is [redacted] weeks pregnant.

## 2022-04-26 NOTE — ED Provider Notes (Signed)
[facility]   MRN: [id number] DOB: 22-Feb-1991  Subjective:   Rachael [name] is a 31 y.o. female presenting for STI check.  In the past week, patient developed some bumps that have come and gone.  Generally they did not bother her but a few times when she was showering.  She is [redacted] weeks pregnant.  Has a monogamous relationship.  Her partner just went and got checked and was negative.  She would like to have STI testing done.  Otherwise no urinary symptoms, nausea, vomiting, abdominal or pelvic pains.  She does have some discharge and this has come and gone.  At times it is very thick and odorous but other times is completely normal.  No current facility-administered medications for this encounter. No current outpatient medications on file.   No Known Allergies  Past Medical History:  Diagnosis Date  . Difficult intubation 08/15/14  . Heart murmur   . Nonsmoker      History reviewed. No pertinent surgical history.  Family History  Problem Relation Age of Onset  . Cancer Paternal Grandmother        breast cancer  . Heart attack Father        Sounds like arrythemia; died in 68s  . Hypertension Mother   . Sleep apnea Mother   . Heart murmur Brother   . Heart murmur Sister     Social History   Tobacco Use  . Smoking status: Never  . Smokeless tobacco: Never  Vaping Use  . Vaping Use: Never used  Substance Use Topics  . Alcohol use: Not Currently  . Drug use: Never    ROS   Objective:   Vitals: BP 124/66   Pulse 81   Temp 98.2 F (36.8 C)   Resp 20   SpO2 98%   Physical Exam Exam conducted with a chaperone present Hydrologist).  Constitutional:      General: She is not in acute distress.    Appearance: Normal appearance. She is well-developed. She is not ill-appearing, toxic-appearing or diaphoretic.  HENT:     Head: Normocephalic and atraumatic.     Nose: Nose normal.     Mouth/Throat:     Mouth: Mucous membranes are moist.   Eyes:     General: No scleral icterus.       Right eye: No discharge.        Left eye: No discharge.     Extraocular Movements: Extraocular movements intact.  Cardiovascular:     Rate and Rhythm: Normal rate.  Pulmonary:     Effort: Pulmonary effort is normal.  Genitourinary:    Labia:        Right: No rash, tenderness, lesion or injury.        Left: No rash, tenderness or lesion.     Skin:    General: Skin is warm and dry.  Neurological:     General: No focal deficit present.     Mental Status: She is alert and oriented to person, place, and time.  Psychiatric:        Mood and Affect: Mood normal.        Behavior: Behavior normal.    Assessment and Plan :   PDMP not reviewed this encounter.  1. Subacute vaginitis   2. Pain of female genitalia   3. Vaginal discharge   4. [redacted] weeks gestation of pregnancy    Labs pending, will treat as appropriate. Patient also prefers to hold off  on treatments since she is pregnant and base it off of results. Will follow up as soon as we get them.     Wallis Bamberg, [place] 04/26/22 1734

## 2022-04-27 ENCOUNTER — Other Ambulatory Visit (HOSPITAL_COMMUNITY): Payer: Self-pay

## 2022-04-27 ENCOUNTER — Telehealth (HOSPITAL_COMMUNITY): Payer: Self-pay | Admitting: Emergency Medicine

## 2022-04-27 LAB — CERVICOVAGINAL ANCILLARY ONLY
Bacterial Vaginitis (gardnerella): POSITIVE — AB
Candida Glabrata: NEGATIVE
Candida Vaginitis: POSITIVE — AB
Chlamydia: NEGATIVE
Comment: NEGATIVE
Comment: NEGATIVE
Comment: NEGATIVE
Comment: NEGATIVE
Comment: NEGATIVE
Comment: NORMAL
Neisseria Gonorrhea: NEGATIVE
Trichomonas: NEGATIVE

## 2022-04-27 MED ORDER — CLOTRIMAZOLE 1 % VA CREA
1.0000 | TOPICAL_CREAM | Freq: Every day | VAGINAL | 0 refills | Status: DC
  Filled 2022-04-27 (×2): qty 45, fill #0

## 2022-04-27 MED ORDER — METRONIDAZOLE 0.75 % VA GEL
1.0000 | Freq: Every day | VAGINAL | 0 refills | Status: AC
  Filled 2022-04-27: qty 70, 5d supply, fill #0

## 2022-04-28 ENCOUNTER — Other Ambulatory Visit (HOSPITAL_COMMUNITY): Payer: Self-pay

## 2022-04-28 ENCOUNTER — Other Ambulatory Visit: Payer: Self-pay | Admitting: Urgent Care

## 2022-04-28 LAB — HSV CULTURE AND TYPING

## 2022-04-28 NOTE — Progress Notes (Signed)
I was attempting to prescribe medications to treat the patient for BV and yeast infection.  However, chart review shows that these prescriptions were already sent for MetroGel and topical clotrimazole.  Therefore we will hold off on further prescriptions.

## 2022-05-03 ENCOUNTER — Encounter (HOSPITAL_BASED_OUTPATIENT_CLINIC_OR_DEPARTMENT_OTHER): Payer: Self-pay

## 2022-05-03 ENCOUNTER — Encounter (HOSPITAL_BASED_OUTPATIENT_CLINIC_OR_DEPARTMENT_OTHER): Payer: Self-pay | Admitting: Obstetrics & Gynecology

## 2022-05-03 ENCOUNTER — Other Ambulatory Visit (HOSPITAL_COMMUNITY)
Admission: RE | Admit: 2022-05-03 | Discharge: 2022-05-03 | Disposition: A | Discharge status: Home / Self Care | Payer: No Typology Code available for payment source | Source: Ambulatory Visit | Attending: Obstetrics & Gynecology | Admitting: Obstetrics & Gynecology

## 2022-05-03 ENCOUNTER — Ambulatory Visit (INDEPENDENT_AMBULATORY_CARE_PROVIDER_SITE_OTHER): Payer: No Typology Code available for payment source | Admitting: *Deleted

## 2022-05-03 ENCOUNTER — Ambulatory Visit (INDEPENDENT_AMBULATORY_CARE_PROVIDER_SITE_OTHER): Payer: No Typology Code available for payment source | Admitting: Obstetrics & Gynecology

## 2022-05-03 VITALS — BP 139/69 | HR 70 | Wt 154.6 lb

## 2022-05-03 DIAGNOSIS — Z3490 Encounter for supervision of normal pregnancy, unspecified, unspecified trimester: Secondary | ICD-10-CM | POA: Insufficient documentation

## 2022-05-03 DIAGNOSIS — O0993 Supervision of high risk pregnancy, unspecified, third trimester: Secondary | ICD-10-CM | POA: Insufficient documentation

## 2022-05-03 DIAGNOSIS — Z3481 Encounter for supervision of other normal pregnancy, first trimester: Secondary | ICD-10-CM | POA: Diagnosis present

## 2022-05-03 DIAGNOSIS — Z3A10 10 weeks gestation of pregnancy: Secondary | ICD-10-CM

## 2022-05-03 DIAGNOSIS — Z3480 Encounter for supervision of other normal pregnancy, unspecified trimester: Secondary | ICD-10-CM

## 2022-05-03 LAB — POCT URINALYSIS DIPSTICK OB
Appearance: NORMAL
Bilirubin, UA: NEGATIVE
Blood, UA: NEGATIVE
Glucose, UA: NEGATIVE
Ketones, UA: NEGATIVE
Leukocytes, UA: NEGATIVE
Nitrite, UA: NEGATIVE
Spec Grav, UA: 1.025 (ref 1.010–1.025)
Urobilinogen, UA: 0.2 E.U./dL
pH, UA: 5.5 (ref 5.0–8.0)

## 2022-05-03 NOTE — Progress Notes (Signed)
History:   Rachael [name] is a 31 y.o. G3P0020 at [redacted]w[redacted]d by early ultrasound being seen today for her first obstetrical visit.  Her obstetrical history is significant for  no current issues . Patient does intend to breast feed. Pregnancy history fully reviewed.  Patient reports fatigue.      HISTORY: OB History  Gravida Para Term Preterm AB Living  3 0 0 0 2 0  SAB IAB Ectopic Multiple Live Births  1 1 0 0 0    # Outcome Date GA Lbr Len/2nd Weight Sex Delivery Anes PTL Lv  3 Current           2 SAB 01/21/22 [redacted]w[redacted]d         1 IAB 09/18/21            Last pap smear was done 09/2019 and was normal  Past Medical History:  Diagnosis Date   Difficult intubation 08/15/14   Heart murmur    Nonsmoker    Past Surgical History:  Procedure Laterality Date   COSMETIC SURGERY     Family History  Problem Relation Age of Onset   Hypertension Mother    Sleep apnea Mother    Heart attack Father        Sounds like arrythemia; died in 30s   Heart murmur Sister    Heart murmur Brother    Cancer Paternal Grandmother        breast cancer   Social History   Tobacco Use   Smoking status: Never   Smokeless tobacco: Never  Vaping Use   Vaping Use: Never used  Substance Use Topics   Alcohol use: Not Currently   Drug use: Never   No Known Allergies No current outpatient medications on file prior to visit.   No current facility-administered medications on file prior to visit.    Review of Systems Pertinent items noted in HPI and remainder of comprehensive ROS otherwise negative.  Physical Exam:   Vitals:   05/03/22 1426  BP: 139/69  Pulse: 70  Weight: 154 lb 9.6 oz (70.1 kg)     Bedside Ultrasound for FHR check: Viable intrauterine pregnancy with positive cardiac activity noted, fetal heart rate 160bpm Patient informed that the ultrasound is considered a limited obstetric ultrasound and is not intended to be a complete ultrasound exam.  Patient also informed that the  ultrasound is not being completed with the intent of assessing for fetal or placental anomalies or any pelvic abnormalities.  Explained that the purpose of today's ultrasound is to assess for fetal heart rate.  Patient acknowledges the purpose of the exam and the limitations of the study. General: well-developed, well-nourished female in no acute distress  Breasts:  deferred  Skin: normal coloration and turgor, no rashes  Neurologic: oriented, normal, negative, normal mood  Extremities: normal strength, tone, and muscle mass, ROM of all joints is normal  HEENT PERRLA, extraocular movement intact and sclera clear, anicteric  Neck supple and no masses  Cardiovascular: regular rate and rhythm  Respiratory:  no respiratory distress, normal breath sounds  Abdomen: soft, non-tender; bowel sounds normal; no masses,  no organomegaly  Pelvic: normal external genitalia, no lesions, normal vaginal mucosa, normal vaginal discharge, normal cervix, no pap smear done. Uterine size: 10 weeks    Assessment:    Pregnancy: [id number] Patient Active Problem List   Diagnosis Date Noted   Supervision of other normal pregnancy, antepartum 05/03/2022     Plan:  1. Encounter for supervision of  other normal pregnancy in first trimester - Panorama Prenatal Test Full Panel - HORIZON Custom - ABO/Rh - Antibody screen - CBC - Hepatitis B surface antigen - HIV Antibody (routine testing w rflx) - HIV (Save tube for possible reflex) - RPR - Rubella screen - Hepatitis C antibody - Urine Culture - Cervicovaginal ancillary only( [place]) - [place] MFM OB COMP + 14 WK; Future  2. [redacted] weeks gestation of pregnancy   Initial labs drawn. Continue prenatal vitamins. Problem list reviewed and updated. Genetic Screening discussed, NIPS: ordered. Ultrasound discussed; fetal anatomic survey: ordered. Anticipatory guidance about prenatal visits given including labs, ultrasounds, and testing. Discussed usage of Babyscripts  and virtual visits as additional source of managing and completing prenatal visits in midst of coronavirus and pandemic.   Encouraged to complete MyChart Registration for her ability to review results, send requests, and have questions addressed.  The nature of [place] [facility] for [facility] Healthcare/Faculty Practice with multiple MDs and Advanced Practice Providers was explained to patient; also emphasized that residents, students are part of our team. Routine obstetric precautions reviewed. Encouraged to seek out care at office or emergency room [facility] preferred) for urgent and/or emergent concerns. Return in about 4 weeks (around 05/31/2022).     Lum Keas, MD, FACOG Obstetrician & Gynecologist, [facility] for [facility], [facility]

## 2022-05-03 NOTE — Progress Notes (Signed)
New OB Intake  I explained I am completing New OB Intake today. We discussed her EDD of 11/29/22 that is based on u/s of 8+0 at 04/19/22. Pt is G3/P0. I reviewed her allergies, medications, Medical/Surgical/OB history, and appropriate screenings.uncomplicated .   Patient Active Problem List   Diagnosis Date Noted   Supervision of other normal pregnancy, antepartum 05/03/2022    Concerns addressed today  Delivery Plans Plans to deliver at [facility]. Patient given information for [facility] Healthy Baby website for more information about [facility]. Patient is not interested in water birth.   MyChart/Babyscripts MyChart access verified. I explained pt will have some visits in office and some virtually. Babyscripts instructions given and order placed.   Blood Pressure Cuff/Weight Scale Patient has private insurance; instructed to purchase blood pressure cuff and bring to first prenatal appt. Explained after first prenatal appt pt will check weekly and document in Babyscripts.   Anatomy US Explained first scheduled [place] will be around 19 weeks. Anatomy US scheduled for 07/05/22 at 1445. Pt notified to arrive at 1430.  Labs Discussed Avelina Laine genetic screening with patient. Would like both Panorama and Horizon drawn at new OB visit. Routine prenatal labs needed.   Social Determinants of Health Food Insecurity: Patient denies food insecurity. WIC Referral: Patient is not interested in referral to [facility].  Transportation: Patient denies transportation needs.    Harrie Jeans, RN 05/03/2022  3:31 PM

## 2022-05-03 NOTE — Progress Notes (Deleted)
History:   Rachael [name] is a 31 y.o. G3P0020 at [redacted]w[redacted]d by {Ob dating:14516} being seen today for her first obstetrical visit.  Her obstetrical history is significant for {ob risk factors:10154}. Patient {does/does not:19097} intend to breast feed. Pregnancy history fully reviewed.  Patient reports {sx:14538}.      HISTORY: OB History  Gravida Para Term Preterm AB Living  3 0 0 0 2 0  SAB IAB Ectopic Multiple Live Births  1 1 0 0 0    # Outcome Date GA Lbr Len/2nd Weight Sex Delivery Anes PTL Lv  3 Current           2 SAB 01/21/22 [redacted]w[redacted]d         1 IAB 09/18/21            Last pap smear was done *** and was {Normal/Abnormal Appearance:21344::"normal"}  Past Medical History:  Diagnosis Date   Difficult intubation 08/15/14   Heart murmur    Nonsmoker    No past surgical history on file. Family History  Problem Relation Age of Onset   Cancer Paternal Grandmother        breast cancer   Heart attack Father        Sounds like arrythemia; died in 58s   Hypertension Mother    Sleep apnea Mother    Heart murmur Brother    Heart murmur Sister    Social History   Tobacco Use   Smoking status: Never   Smokeless tobacco: Never  Vaping Use   Vaping Use: Never used  Substance Use Topics   Alcohol use: Not Currently   Drug use: Never   No Known Allergies Current Outpatient Medications on File Prior to Visit  Medication Sig Dispense Refill   clotrimazole (GYNE-LOTRIMIN) 1 % vaginal cream Place 1 Applicatorful vaginally at bedtime. 45 g 0   No current facility-administered medications on file prior to visit.    Review of Systems Pertinent items noted in HPI and remainder of comprehensive ROS otherwise negative.  Physical Exam:  There were no vitals filed for this visit.   ***Bedside Ultrasound for FHR check: Viable intrauterine pregnancy with positive cardiac activity noted, fetal heart rate ***bpm Patient informed that the ultrasound is considered a limited obstetric  ultrasound and is not intended to be a complete ultrasound exam.  Patient also informed that the ultrasound is not being completed with the intent of assessing for fetal or placental anomalies or any pelvic abnormalities.  Explained that the purpose of today's ultrasound is to assess for fetal heart rate.  Patient acknowledges the purpose of the exam and the limitations of the study. General: well-developed, well-nourished female in no acute distress  Breasts:  normal appearance, no masses or tenderness bilaterally  Skin: normal coloration and turgor, no rashes  Neurologic: oriented, normal, negative, normal mood  Extremities: normal strength, tone, and muscle mass, ROM of all joints is normal  HEENT PERRLA, extraocular movement intact and sclera clear, anicteric  Neck supple and no masses  Cardiovascular: regular rate and rhythm  Respiratory:  no respiratory distress, normal breath sounds  Abdomen: soft, non-tender; bowel sounds normal; no masses,  no organomegaly  Pelvic: normal external genitalia, no lesions, normal vaginal mucosa, normal vaginal discharge, normal cervix, ***pap smear done. Uterine size:      Assessment:    Pregnancy: [id number] Patient Active Problem List   Diagnosis Date Noted   Altered mental status    Collapse    Intoxication 08/15/2014  Plan:    There are no diagnoses linked to this encounter.  Initial labs drawn. Continue prenatal vitamins. Problem list reviewed and updated. Genetic Screening discussed, {Blank multiple:19196::"First trimester screen","Quad screen","NIPS"}: {requests/ordered/declines:14581}. Ultrasound discussed; fetal anatomic survey: {requests/ordered/declines:14581}. Anticipatory guidance about prenatal visits given including labs, ultrasounds, and testing. Discussed usage of Babyscripts and virtual visits as additional source of managing and completing prenatal visits in midst of coronavirus and pandemic.   Encouraged to complete MyChart  Registration for her ability to review results, send requests, and have questions addressed.  The nature of [place] - Center for [facility] Healthcare/Faculty Practice with multiple MDs and Advanced Practice Providers was explained to patient; also emphasized that residents, students are part of our team. Routine obstetric precautions reviewed. Encouraged to seek out care at office or emergency room [facility] preferred) for urgent and/or emergent concerns. No follow-ups on file.     Lum Keas, MD, FACOG Obstetrician & Gynecologist, [facility] for [facility], [facility]

## 2022-05-04 LAB — HEPATITIS B SURFACE ANTIGEN: Hepatitis B Surface Ag: NEGATIVE

## 2022-05-04 LAB — CBC
Hematocrit: 36.4 % (ref 34.0–46.6)
Hemoglobin: 12.2 g/dL (ref 11.1–15.9)
MCH: 29.3 pg (ref 26.6–33.0)
MCHC: 33.5 g/dL (ref 31.5–35.7)
MCV: 87 fL (ref 79–97)
Platelets: 246 10*3/uL (ref 150–450)
RBC: 4.17 x10E6/uL (ref 3.77–5.28)
RDW: 12.5 % (ref 11.7–15.4)
WBC: 8.8 10*3/uL (ref 3.4–10.8)

## 2022-05-04 LAB — ABO/RH: Rh Factor: POSITIVE

## 2022-05-04 LAB — CERVICOVAGINAL ANCILLARY ONLY
Chlamydia: NEGATIVE
Comment: NEGATIVE
Comment: NORMAL
Neisseria Gonorrhea: NEGATIVE

## 2022-05-04 LAB — HEPATITIS C ANTIBODY: Hep C Virus Ab: NONREACTIVE

## 2022-05-04 LAB — ANTIBODY SCREEN: Antibody Screen: NEGATIVE

## 2022-05-04 LAB — RPR: RPR Ser Ql: NONREACTIVE

## 2022-05-04 LAB — RUBELLA SCREEN: Rubella Antibodies, IGG: 4.03 index (ref >0.99)

## 2022-05-04 LAB — HIV ANTIBODY (ROUTINE TESTING W REFLEX): HIV Screen 4th Generation wRfx: NONREACTIVE

## 2022-05-05 ENCOUNTER — Encounter (HOSPITAL_BASED_OUTPATIENT_CLINIC_OR_DEPARTMENT_OTHER): Payer: Self-pay | Admitting: Obstetrics & Gynecology

## 2022-05-05 LAB — URINE CULTURE

## 2022-05-05 MED ORDER — PRENATAL VITAMIN 27-0.8 MG PO TABS: 1.0000 | ORAL_TABLET | Freq: Every day | ORAL | Status: DC

## 2022-05-10 LAB — PANORAMA PRENATAL TEST FULL PANEL:PANORAMA TEST PLUS 5 ADDITIONAL MICRODELETIONS: FETAL FRACTION: 12.1

## 2022-05-12 LAB — HORIZON CUSTOM: REPORT SUMMARY: POSITIVE — AB

## 2022-05-15 ENCOUNTER — Encounter (HOSPITAL_BASED_OUTPATIENT_CLINIC_OR_DEPARTMENT_OTHER): Payer: Self-pay | Admitting: *Deleted

## 2022-05-15 DIAGNOSIS — D563 Thalassemia minor: Secondary | ICD-10-CM | POA: Insufficient documentation

## 2022-06-06 ENCOUNTER — Ambulatory Visit (INDEPENDENT_AMBULATORY_CARE_PROVIDER_SITE_OTHER): Payer: No Typology Code available for payment source | Admitting: Advanced Practice Midwife

## 2022-06-06 VITALS — BP 123/70 | HR 67 | Wt 152.6 lb

## 2022-06-06 DIAGNOSIS — Z3481 Encounter for supervision of other normal pregnancy, first trimester: Secondary | ICD-10-CM

## 2022-06-06 DIAGNOSIS — B3731 Acute candidiasis of vulva and vagina: Secondary | ICD-10-CM

## 2022-06-06 DIAGNOSIS — Z3A14 14 weeks gestation of pregnancy: Secondary | ICD-10-CM

## 2022-06-06 DIAGNOSIS — R12 Heartburn: Secondary | ICD-10-CM

## 2022-06-06 DIAGNOSIS — O26892 Other specified pregnancy related conditions, second trimester: Secondary | ICD-10-CM

## 2022-06-06 DIAGNOSIS — R0789 Other chest pain: Secondary | ICD-10-CM

## 2022-06-06 MED ORDER — FAMOTIDINE 20 MG PO TABS: 20.0000 mg | ORAL_TABLET | Freq: Two times a day (BID) | ORAL | 2 refills | Status: DC

## 2022-06-06 MED ORDER — TERCONAZOLE 0.4 % VA CREA: 1.0000 | TOPICAL_CREAM | Freq: Every day | VAGINAL | 1 refills | Status: DC

## 2022-06-06 NOTE — Progress Notes (Unsigned)
   PRENATAL VISIT NOTE  Subjective:  Rachael [name] is a 31 y.o. G3P0020 at [redacted]w[redacted]d being seen today for ongoing prenatal care.  She is currently monitored for the following issues for this low-risk pregnancy and has Supervision of other normal pregnancy, antepartum and Alpha thalassemia silent carrier on their problem list.  Patient reports  vaginal discharge with itching, recent course of abx, chest pressure when supine, resolves with heat/shower .  Contractions: Not present. Vag. Bleeding: None.  Movement: Absent. Denies leaking of fluid.   The following portions of the patient's history were reviewed and updated as appropriate: allergies, current medications, past family history, past medical history, past social history, past surgical history and problem list.   Objective:   Vitals:   06/06/22 1541  BP: 123/70  Pulse: 67  Weight: 152 lb 9.6 oz (69.2 kg)    Fetal Status: Fetal Heart Rate (bpm): 153   Movement: Absent     General:  Alert, oriented and cooperative. Patient is in no acute distress.  Skin: Skin is warm and dry. No rash noted.   Cardiovascular: Normal heart rate noted  Respiratory: Normal respiratory effort, no problems with respiration noted  Abdomen: Soft, gravid, appropriate for gestational age.  Pain/Pressure: Absent     Pelvic: Cervical exam deferred        Extremities: Normal range of motion.  Edema: None  Mental Status: Normal mood and affect. Normal behavior. Normal judgment and thought content.   Assessment and Plan:  Pregnancy: [id number] at [redacted]w[redacted]d 1. Encounter for supervision of other normal pregnancy in first trimester --Anticipatory guidance about next visits/weeks of pregnancy given.   2. [redacted] weeks gestation of pregnancy   3. Chest pressure --occurs at night, not daily, improved with heating pack or warm shower --Heart and lung sounds wnl, VS normal --Precautions/reasons to seek emergent care reviewed  - AMB Referral to [facility]  4.  Heartburn during pregnancy in second trimester --Chest symptoms when supine, will treat for acid reflux, pt to f/u with cardiology - famotidine (PEPCID) 20 MG tablet; Take 1 tablet (20 mg total) by mouth 2 (two) times daily.  Dispense: 30 tablet; Refill: 2  5. Vaginal candidiasis  - terconazole (TERAZOL 7) 0.4 % vaginal cream; Place 1 applicator vaginally at bedtime.  Dispense: 45 g; Refill: 1   Preterm labor symptoms and general obstetric precautions including but not limited to vaginal bleeding, contractions, leaking of fluid and fetal movement were reviewed in detail with the patient. Please refer to After Visit Summary for other counseling recommendations.   No follow-ups on file.  Future Appointments  Date Time Provider [place]  07/05/2022  2:45 PM [facility] US4 [facility]  07/05/2022  4:15 PM Megan Salon, MD [facility]  08/03/2022  4:00 PM Megan Salon, MD [facility]  08/31/2022  9:35 AM Megan Salon, MD [facility]  09/20/2022  4:15 PM Megan Salon, MD [facility]  10/05/2022  3:35 PM Megan Salon, MD [facility]  10/19/2022  8:35 AM Megan Salon, MD [facility]  11/02/2022  3:55 PM Megan Salon, MD [facility]  11/09/2022  8:35 AM Megan Salon, MD [facility]  11/16/2022  3:55 PM Megan Salon, MD [facility]  11/23/2022  8:15 AM Megan Salon, MD [facility]    Fatima Blank, CNM

## 2022-07-05 ENCOUNTER — Ambulatory Visit: Payer: No Typology Code available for payment source | Attending: Obstetrics & Gynecology

## 2022-07-05 ENCOUNTER — Ambulatory Visit (INDEPENDENT_AMBULATORY_CARE_PROVIDER_SITE_OTHER): Payer: No Typology Code available for payment source | Admitting: Obstetrics & Gynecology

## 2022-07-05 ENCOUNTER — Ambulatory Visit: Payer: No Typology Code available for payment source | Admitting: *Deleted

## 2022-07-05 VITALS — BP 124/56 | HR 73

## 2022-07-05 VITALS — BP 126/72 | HR 79 | Wt 158.8 lb

## 2022-07-05 DIAGNOSIS — Z3480 Encounter for supervision of other normal pregnancy, unspecified trimester: Secondary | ICD-10-CM | POA: Insufficient documentation

## 2022-07-05 DIAGNOSIS — Z148 Genetic carrier of other disease: Secondary | ICD-10-CM | POA: Insufficient documentation

## 2022-07-05 DIAGNOSIS — Z3481 Encounter for supervision of other normal pregnancy, first trimester: Secondary | ICD-10-CM | POA: Insufficient documentation

## 2022-07-05 DIAGNOSIS — Z3A19 19 weeks gestation of pregnancy: Secondary | ICD-10-CM | POA: Diagnosis not present

## 2022-07-05 DIAGNOSIS — D563 Thalassemia minor: Secondary | ICD-10-CM | POA: Insufficient documentation

## 2022-07-05 DIAGNOSIS — Z363 Encounter for antenatal screening for malformations: Secondary | ICD-10-CM | POA: Insufficient documentation

## 2022-07-05 NOTE — Progress Notes (Signed)
   PRENATAL VISIT NOTE  Subjective:  Rachael [name] is a 31 y.o. G3P0020 at [redacted]w[redacted]d being seen today for ongoing prenatal care.  She is currently monitored for the following issues for this low-risk pregnancy and has Supervision of other normal pregnancy, antepartum and Alpha thalassemia silent carrier on their problem list.  Patient reports no complaints.  Contractions: Not present. Vag. Bleeding: None.  Movement: Present. Denies leaking of fluid.   The following portions of the patient's history were reviewed and updated as appropriate: allergies, current medications, past family history, past medical history, past social history, past surgical history and problem list.   Objective:   Vitals:   07/05/22 1654  BP: 126/72  Pulse: 79  Weight: 158 lb 12.8 oz (72 kg)    Fetal Status: Fetal Heart Rate (bpm): 142   Movement: Present     General:  Alert, oriented and cooperative. Patient is in no acute distress.  Skin: Skin is warm and dry. No rash noted.   Cardiovascular: Normal heart rate noted  Respiratory: Normal respiratory effort, no problems with respiration noted  Abdomen: Soft, gravid, appropriate for gestational age.  Pain/Pressure: Absent     Pelvic: Cervical exam deferred        Extremities: Normal range of motion.     Mental Status: Normal mood and affect. Normal behavior. Normal judgment and thought content.   Assessment and Plan:  Pregnancy: G3P0020 at [redacted]w[redacted]d 1. Supervision of other normal pregnancy, antepartum - on PNV - had anatomy scan today.  Will need follow up 4 weeks to complete anatomy survey.  2. [redacted] weeks gestation of pregnancy - AFP, Serum, Open Spina Bifida  3. Alpha thalassemia silent carrier - FOB declines testing at this time  Preterm labor symptoms and general obstetric precautions including but not limited to vaginal bleeding, contractions, leaking of fluid and fetal movement were reviewed in detail with the patient. Please refer to After Visit Summary  for other counseling recommendations.   Return in about 4 weeks (around 08/02/2022).  Future Appointments  Date Time Provider [place]  07/07/2022  3:40 PM Freada Bergeron, MD [facility] None  08/03/2022 12:30 PM [facility] NURSE [facility] 2  08/03/2022 12:45 PM [facility] US4 [facility]  08/03/2022  4:00 PM Megan Salon, MD [facility]  08/31/2022  9:35 AM Megan Salon, MD [facility]  09/20/2022  4:15 PM Megan Salon, MD [facility]  10/05/2022  3:35 PM Megan Salon, MD [facility]  10/19/2022  8:35 AM Megan Salon, MD [facility]  11/02/2022  3:55 PM Megan Salon, MD [facility]  11/09/2022  8:35 AM Megan Salon, MD [facility]  11/16/2022  3:55 PM Megan Salon, MD [facility]  11/23/2022  8:15 AM Megan Salon, MD [facility]    Megan Salon, MD

## 2022-07-06 ENCOUNTER — Other Ambulatory Visit: Payer: Self-pay | Admitting: *Deleted

## 2022-07-06 DIAGNOSIS — Z362 Encounter for other antenatal screening follow-up: Secondary | ICD-10-CM

## 2022-07-06 DIAGNOSIS — D563 Thalassemia minor: Secondary | ICD-10-CM

## 2022-07-06 NOTE — Progress Notes (Signed)
Cardio-Obstetrics Clinic  New Evaluation  Date:  07/07/2022   ID:  Rachael [name], DOB Jun 07, 1991, MRN [id number]  PCP:  Patient, No Pcp Per   [facility] Providers Cardiologist:  Meriam Sprague, MD  Electrophysiologist:  None     Referring MD: Hurshel Party,*   Chief Complaint: chest pressure  History of Present Illness:    Rachael [name] is a 31 y.o. female [G3P0020] who is being seen today for the evaluation of chest pressure at the request of [place], Wilmer Floor,*.   Patient seen by Sharen Counter on 06/06/22. Note reviewed. Patient reported chest pain that only occurred at night. Given symptoms, she is referred to Cardiology for further evaluation.  Today, patient is currently [redacted]w[redacted]d pregnant. She states that she started to feel a heaviness in her chest that worsened when laying flat a couple of weeks ago. Symptoms improved with sitting up in bed and placing a heating pad on her chest. States symptoms only occur at night when she lays down with no exertional chest pain or SOB. She was started on an antiacid which has helped significantly. Notably, her father passed away from a MI at age 38 which is why she is concerned about heart disease.   She is otherwise active. No lightheadedness, dizziness, PND, or LE edema.    Prior CV Studies Reviewed: The following studies were reviewed today:   Past Medical History:  Diagnosis Date   Difficult intubation 08/15/14   Heart murmur    Nonsmoker     Past Surgical History:  Procedure Laterality Date   COSMETIC SURGERY     WISDOM TOOTH EXTRACTION      OB History     Gravida  3   Para  0   Term      Preterm      AB  2   Living         SAB  1   IAB  1   Ectopic      Multiple      Live Births                  Current Medications: Current Meds  Medication Sig   famotidine (PEPCID) 20 MG tablet Take 1 tablet (20 mg total) by mouth 2 (two) times daily.   Prenatal Vit-Fe  Fumarate-FA (PRENATAL VITAMIN) 27-0.8 MG TABS Take 1 tablet by mouth daily in the afternoon.     Allergies:   Patient has no known allergies.   Social History   Socioeconomic History   Marital status: Single    Spouse name: Not on file   Number of children: Not on file   Years of education: Not on file   Highest education level: Not on file  Occupational History   Not on file  Tobacco Use   Smoking status: Never   Smokeless tobacco: Never  Vaping Use   Vaping Use: Never used  Substance and Sexual Activity   Alcohol use: Not Currently   Drug use: Never   Sexual activity: Yes    Birth control/protection: None  Other Topics Concern   Not on file  Social History Narrative   Lives with friend. Family lives nearby in [place]   Social Determinants of Health   Financial Resource Strain: Low Risk  (05/03/2022)   Overall Financial Resource Strain (CARDIA)    Difficulty of Paying Living Expenses: Not hard at all  Food Insecurity: No Food Insecurity (05/03/2022)   Hunger Vital Sign  Worried About Charity fundraiser in the Last Year: Never true    [place] in the Last Year: Never true  Transportation Needs: No Transportation Needs (05/03/2022)   PRAPARE - Hydrologist (Medical): No    Lack of Transportation (Non-Medical): No  Physical Activity: Insufficiently Active (05/03/2022)   Exercise Vital Sign    Days of Exercise per Week: 3 days    Minutes of Exercise per Session: 20 min  Stress: No Stress Concern Present (05/03/2022)   [place]    Feeling of Stress : Only a little  Social Connections: Socially Isolated (05/03/2022)   Social Connection and Isolation Panel [NHANES]    Frequency of Communication with Friends and Family: Three times a week    Frequency of Social Gatherings with Friends and Family: Twice a week    Attends Religious Services: Never    Corporate treasurer or Organizations: No    Attends Music therapist: Never    Marital Status: Never married      Family History  Problem Relation Age of Onset   Asthma Mother    Hypertension Mother    Sleep apnea Mother    Heart disease Father    Heart attack Father        Sounds like arrythemia; died in 41s   Heart murmur Sister    Heart murmur Brother    Cancer Paternal Grandmother        breast cancer   Diabetes Neg Hx       ROS:   Please see the history of present illness.    All other systems reviewed and are negative.   Labs/EKG Reviewed:    EKG:   EKG is  ordered today.  The ekg ordered today demonstrates sinus tach with HR 101  Recent Labs: 05/03/2022: Hemoglobin 12.2; Platelets 246   Recent Lipid Panel No results found for: "CHOL", "TRIG", "HDL", "CHOLHDL", "LDLCALC", "LDLDIRECT"  Physical Exam:    VS:  BP (!) 125/47   Pulse 86   Ht 5\' 3"  (1.6 m)   Wt 161 lb (73 kg)   SpO2 100%   BMI 28.52 kg/m     Wt Readings from Last 3 Encounters:  07/07/22 161 lb (73 kg)  07/05/22 158 lb 12.8 oz (72 kg)  06/06/22 152 lb 9.6 oz (69.2 kg)     GEN:  Well nourished, well developed in no acute distress HEENT: Normal NECK: No JVD; No carotid bruits CARDIAC: RRR, 2/6 systolic murmur at RUSB RESPIRATORY:  Clear to auscultation without rales, wheezing or rhonchi  ABDOMEN: Gravid, soft MUSCULOSKELETAL:  No edema; No deformity  SKIN: Warm and dry NEUROLOGIC:  Alert and oriented x 3 PSYCHIATRIC:  Normal affect    Risk Assessment/Risk Calculators:     ASSESSMENT & PLAN:    #Chest Pain: Likely GERD as symptoms only occur with laying down at night and have improved with tums. No exertional symptoms, however, given strong family history of CAD (father passed away at 36 from MI), will check TTE to assess further. Once she is no longer pregnant, can proceed with NMR, Lp(a), apolipoprotein B testing as well as Ca scoring.  -Check TTE -Plan to check NMR, Lp(a),  apolipoprotein B,  Ca scoring once she is no longer pregnant  Patient Instructions  Medication Instructions:  Your physician recommends that you continue on your current medications as directed. Please refer to  the Current Medication list given to you today.  *If you need a refill on your cardiac medications before your next appointment, please call your pharmacy*  Lab Work: If you have labs (blood work) drawn today and your tests are completely normal, you will receive your results only by: MyChart Message (if you have MyChart) OR A paper copy in the mail If you have any lab test that is abnormal or we need to change your treatment, we will call you to review the results.  Testing/Procedures: Your physician has requested that you have an echocardiogram. Echocardiography is a painless test that uses sound waves to create images of your heart. It provides your doctor with information about the size and shape of your heart and how well your heart's chambers and valves are working. This procedure takes approximately one hour. There are no restrictions for this procedure. Please do NOT wear cologne, perfume, aftershave, or lotions (deodorant is allowed). Please arrive 15 minutes prior to your appointment time. Cardiac-OB patient: to be performed by [place] or [place].   Follow-Up: At [facility], you and your health needs are our priority.  As part of our continuing mission to provide you with exceptional heart care, we have created designated Provider Care Teams.  These Care Teams include your primary Cardiologist (physician) and Advanced Practice Providers (APPs -  Physician Assistants and Nurse Practitioners) who all work together to provide you with the care you need, when you need it.  We recommend signing up for the patient portal called "MyChart".  Sign up information is provided on this After Visit Summary.  MyChart is used to connect with patients for Virtual Visits  (Telemedicine).  Patients are able to view lab/test results, encounter notes, upcoming appointments, etc.  Non-urgent messages can be sent to your provider as well.   To learn more about what you can do with MyChart, go to [email or web address].    Your next appointment:   8 week(s)  The format for your next appointment:   In Person  Provider:   Meriam Sprague, MD      Important Information About Sugar         Dispo:  No follow-ups on file.   Medication Adjustments/Labs and Tests Ordered: Current medicines are reviewed at length with the patient today.  Concerns regarding medicines are outlined above.  Tests Ordered: Orders Placed This Encounter  Procedures   EKG 12-Lead   ECHOCARDIOGRAM COMPLETE   Medication Changes: No orders of the defined types were placed in this encounter.

## 2022-07-07 ENCOUNTER — Encounter: Payer: Self-pay | Admitting: Cardiology

## 2022-07-07 ENCOUNTER — Ambulatory Visit (INDEPENDENT_AMBULATORY_CARE_PROVIDER_SITE_OTHER): Payer: No Typology Code available for payment source | Admitting: Cardiology

## 2022-07-07 VITALS — BP 125/47 | HR 86 | Ht 63.0 in | Wt 161.0 lb

## 2022-07-07 DIAGNOSIS — R079 Chest pain, unspecified: Secondary | ICD-10-CM

## 2022-07-07 DIAGNOSIS — Z8249 Family history of ischemic heart disease and other diseases of the circulatory system: Secondary | ICD-10-CM | POA: Diagnosis not present

## 2022-07-07 MED ORDER — ASPIRIN 81 MG PO TBEC: 81.0000 mg | DELAYED_RELEASE_TABLET | Freq: Every day | ORAL | 12 refills | Status: DC

## 2022-07-07 NOTE — Patient Instructions (Addendum)
Medication Instructions:  Your physician recommends that you continue on your current medications as directed. Please refer to the Current Medication list given to you today.  *If you need a refill on your cardiac medications before your next appointment, please call your pharmacy*  Lab Work: If you have labs (blood work) drawn today and your tests are completely normal, you will receive your results only by: [place] (if you have MyChart) OR A paper copy in the mail If you have any lab test that is abnormal or we need to change your treatment, we will call you to review the results.  Testing/Procedures: Your physician has requested that you have an echocardiogram. Echocardiography is a painless test that uses sound waves to create images of your heart. It provides your doctor with information about the size and shape of your heart and how well your heart's chambers and valves are working. This procedure takes approximately one hour. There are no restrictions for this procedure. Please do NOT wear cologne, perfume, aftershave, or lotions (deodorant is allowed). Please arrive 15 minutes prior to your appointment time. Cardiac-OB patient: to be performed by [place] or [place].   Follow-Up: At [facility], you and your health needs are our priority.  As part of our continuing mission to provide you with exceptional heart care, we have created designated Provider Care Teams.  These Care Teams include your primary Cardiologist (physician) and Advanced Practice Providers (APPs -  Physician Assistants and Nurse Practitioners) who all work together to provide you with the care you need, when you need it.  We recommend signing up for the patient portal called "MyChart".  Sign up information is provided on this After Visit Summary.  MyChart is used to connect with patients for Virtual Visits (Telemedicine).  Patients are able to view lab/test results, encounter notes, upcoming  appointments, etc.  Non-urgent messages can be sent to your provider as well.   To learn more about what you can do with MyChart, go to [email or web address].    Your next appointment:   8 week(s)  The format for your next appointment:   In Person  Provider:   Freada Bergeron, MD      Important Information About [place]

## 2022-07-08 LAB — AFP, SERUM, OPEN SPINA BIFIDA
AFP MoM: 1.39
AFP Value: 75.1 ng/mL
Gest. Age on Collection Date: 19.1 weeks
Maternal Age At EDD: 31.5 yr
OSBR Risk 1 IN: 7407
Test Results:: NEGATIVE
Weight: 158 [lb_av]

## 2022-07-12 ENCOUNTER — Telehealth (HOSPITAL_BASED_OUTPATIENT_CLINIC_OR_DEPARTMENT_OTHER): Payer: Self-pay | Admitting: Obstetrics & Gynecology

## 2022-07-12 NOTE — Telephone Encounter (Signed)
Opened in error

## 2022-07-16 ENCOUNTER — Emergency Department (HOSPITAL_COMMUNITY): Payer: No Typology Code available for payment source

## 2022-07-16 ENCOUNTER — Encounter (HOSPITAL_COMMUNITY): Payer: Self-pay | Admitting: Obstetrics & Gynecology

## 2022-07-16 ENCOUNTER — Emergency Department (HOSPITAL_COMMUNITY)
Admission: AD | Admit: 2022-07-16 | Discharge: 2022-07-16 | Disposition: A | Discharge status: Home / Self Care | Payer: No Typology Code available for payment source | Attending: Obstetrics & Gynecology | Admitting: Obstetrics & Gynecology

## 2022-07-16 DIAGNOSIS — Z3A20 20 weeks gestation of pregnancy: Secondary | ICD-10-CM | POA: Diagnosis not present

## 2022-07-16 DIAGNOSIS — R0602 Shortness of breath: Secondary | ICD-10-CM | POA: Insufficient documentation

## 2022-07-16 DIAGNOSIS — R072 Precordial pain: Secondary | ICD-10-CM | POA: Insufficient documentation

## 2022-07-16 DIAGNOSIS — O26892 Other specified pregnancy related conditions, second trimester: Secondary | ICD-10-CM | POA: Diagnosis present

## 2022-07-16 DIAGNOSIS — R079 Chest pain, unspecified: Secondary | ICD-10-CM | POA: Diagnosis not present

## 2022-07-16 DIAGNOSIS — O99012 Anemia complicating pregnancy, second trimester: Secondary | ICD-10-CM | POA: Insufficient documentation

## 2022-07-16 LAB — CBC
HCT: 31.4 % — ABNORMAL LOW (ref 36.0–46.0)
Hemoglobin: 10.5 g/dL — ABNORMAL LOW (ref 12.0–15.0)
MCH: 29.9 pg (ref 26.0–34.0)
MCHC: 33.4 g/dL (ref 30.0–36.0)
MCV: 89.5 fL (ref 80.0–100.0)
Platelets: 223 10*3/uL (ref 150–400)
RBC: 3.51 MIL/uL — ABNORMAL LOW (ref 3.87–5.11)
RDW: 14 % (ref 11.5–15.5)
WBC: 8.1 10*3/uL (ref 4.0–10.5)
nRBC: 0 % (ref 0.0–0.2)

## 2022-07-16 LAB — BASIC METABOLIC PANEL
Anion gap: 10 (ref 5–15)
BUN: <5 mg/dL — ABNORMAL LOW (ref 6–20)
CO2: 21 mmol/L — ABNORMAL LOW (ref 22–32)
Calcium: 9.1 mg/dL (ref 8.9–10.3)
Chloride: 104 mmol/L (ref 98–111)
Creatinine, Ser: 0.64 mg/dL (ref 0.44–1.00)
GFR, Estimated: >60 mL/min (ref >60)
Glucose, Bld: 88 mg/dL (ref 70–99)
Potassium: 3.7 mmol/L (ref 3.5–5.1)
Sodium: 135 mmol/L (ref 135–145)

## 2022-07-16 LAB — HEPATIC FUNCTION PANEL
ALT: 27 U/L (ref 0–44)
AST: 29 U/L (ref 15–41)
Albumin: 3.2 g/dL — ABNORMAL LOW (ref 3.5–5.0)
Alkaline Phosphatase: 75 U/L (ref 38–126)
Bilirubin, Direct: <0.1 mg/dL (ref 0.0–0.2)
Total Bilirubin: <0.1 mg/dL — ABNORMAL LOW (ref 0.3–1.2)
Total Protein: 6.5 g/dL (ref 6.5–8.1)

## 2022-07-16 LAB — TROPONIN I (HIGH SENSITIVITY)
Troponin I (High Sensitivity): 6 ng/L (ref <18)
Troponin I (High Sensitivity): 4 ng/L (ref <18)

## 2022-07-16 NOTE — MAU Note (Signed)
Report called to ED charge RN- [place]

## 2022-07-16 NOTE — MAU Provider Note (Signed)
Event Date/Time   First Provider Initiated Contact with Patient 07/16/22 [phone]      S Ms. Rachael [name] is a 31 y.o. G3P0020 patient who presents to MAU today with complaint of chest pain & shortness of breath.  Recently started feeling recurrent chest pressure so was seen by cardiology last week. Is supposed to f/u for echo later in the year. Concerned due to family history (father) of sudden cardiac death at 7.  Current symptoms started at 2 am. Reports it feels like an "elephant is sitting on her chest" and she can't catch her breath. Also feels bilateral arm heaviness. Took her famotidine without relief. States these symptoms are different & worse than what she was previously seen for by cards.  Denies abdominal pain or vaginal bleeding.    O BP (!) 123/58 (BP Location: Right Arm)   Pulse (!) 108   Temp 98.4 F (36.9 C) (Oral)   Resp (!) 22   Ht 5\' 3"  (1.6 m)   Wt 73.5 kg   SpO2 99%   BMI 28.70 kg/m  Physical Exam Vitals and nursing note reviewed.  Constitutional:      General: She is not in acute distress.    Appearance: She is well-developed. She is not diaphoretic.  HENT:     Head: Normocephalic and atraumatic.  Cardiovascular:     Rate and Rhythm: Regular rhythm. Tachycardia present.  Pulmonary:     Effort: Pulmonary effort is normal. No respiratory distress.     Breath sounds: Normal breath sounds.  Neurological:     Mental Status: She is alert.    FHT present via doppler  A Medical screening exam complete 1. Chest pain, unspecified type   2. [redacted] weeks gestation of pregnancy      P Transferred to [facility] for rest of work up due to cardiac nature of complaint Report given to Dr. [facility] (EDP) Patient OB cleared (FHT present & no OB complaints)  Clayborne Dana, NP 07/16/2022 6:29 AM

## 2022-07-16 NOTE — MAU Note (Signed)
Pt to wheelchair for transfer to ED for further evaluation. Pt is stable for transfer. NAD.

## 2022-07-16 NOTE — Discharge Instructions (Addendum)
The testing done in the emergency department today is reassuring.  Continue taking your daily famotidine.  You can trial Tums as well for relief of symptoms, when they occur.  You should continue to follow-up with your [facility] doctor as well as your cardiologist.

## 2022-07-16 NOTE — ED Triage Notes (Signed)
Pt here from MAU with reports of shob and chest pressure onset 0200 today. Pt also complains of her arms feeling heavy. Tried famotidine without relief.

## 2022-07-16 NOTE — MAU Note (Signed)
OB sent patient to Maternal Fetal Medicine - she was told that her EKG (sinus tachycardia, T wave abnormality. . Dr Lenn Cal recommended that she have and Echocardiogram, and Doppler studies of her heart.  Pt reports increased anxiety due to sudden death of her father at age 31 of "heart attack."

## 2022-07-16 NOTE — ED Provider Triage Note (Signed)
Emergency Medicine Provider Triage Evaluation Note  Rachael [name] , a 31 y.o. female  was evaluated in triage.  Pt complains of chest pressure and shortness of breath.  This woke her up from sleep in the early morning hours.  This is not a new problem and has been occurring intermittently.  She has had abnormal EKG.  Referred to cardiology by her OB/GYN and is scheduled to have an echocardiogram in January.  She is approximately [redacted] weeks pregnant.  Seen at MAU prior to ED evaluation, cleared from a OB/GYN standpoint.  Family history of early CAD.  Review of Systems  Positive: Chest pressure, shortness of breath Negative: Fever, cough  Physical Exam  BP 117/69 (BP Location: Right Arm)   Pulse (!) 101   Temp 98.8 F (37.1 C)   Resp 15   Ht 5\' 3"  (1.6 m)   Wt 73.5 kg   SpO2 100%   BMI 28.70 kg/m  Gen:   Awake, no distress   Resp:  Normal effort  MSK:   Moves extremities without difficulty  Other:  Lungs clear to auscultation bilaterally, mild tachycardia, patient in no distress  Medical Decision Making  Medically screening exam initiated at 8:02 AM.  Appropriate orders placed.  Rachael [name] was informed that the remainder of the evaluation will be completed by another provider, this initial triage assessment does not replace that evaluation, and the importance of remaining in the ED until their evaluation is complete.     Sherrilyn Rist, PA-C 07/16/22 [phone]

## 2022-07-16 NOTE — MAU Note (Signed)
.  Rachael [name] is a 31 y.o. at [redacted]w[redacted]d here in MAU reporting: feeling tightness in her chest and like "a elephant is sitting on her chest." LMP:  Onset of complaint:  Pain score:  Vitals:   07/16/22 0541 07/16/22 0542  BP:  (!) 123/58  Pulse:  (!) 108  Resp:  (!) 22  Temp:  98.4 F (36.9 C)  SpO2: 99% 99%     FHT: Lab orders placed from triage:

## 2022-07-16 NOTE — MAU Note (Addendum)
Pt reports sudden onset of crushing/heaviness in her chest. Reports feeling SOB, that she can not take a sufficient deep breath. Also states that pain radiates down both arms. Pts respirations are even and unlabored. Sats remain 100% on room air. Reports this is similar pain to previous chest pain but previously pain did not radiate to both arms. States they are heavy and hard to lift. "Like I have worked out"   Pt reports fetal movement. Denies uterine cramping, contractions, leaking or fluid, vaginal bleeding or discharge.

## 2022-07-16 NOTE — ED Provider Notes (Signed)
[facility] EMERGENCY DEPARTMENT Provider Note   CSN: [id number] Arrival date & time: 07/16/22  0534     History  Chief Complaint  Patient presents with   Shortness of Breath   Chest Pain    Rachael [name] is a 31 y.o. female.   Shortness of Breath Associated symptoms: chest pain   Chest Pain Associated symptoms: shortness of breath   Patient resents for chest pain and shortness of breath.  She has no known chronic medical conditions.  She did have a father who passed away from a heart attack at the age of 66.  She states that this is always caused her concern of cardiac conditions.  She has had intermittent chest pain throughout her life.  She is currently [redacted] weeks pregnant.  Although this is not her first pregnancy, it is the first that did not result in a early abortion or miscarriage.  Approximately 5 weeks ago, she developed increased frequency and severity of chest pain.  The symptoms seem to worsen at night.  She was prescribed famotidine which she states has helped, however, she will continue to have symptoms.  Last night, symptoms became severe.  She describes a substernal chest pressure.  This prompted her to come to the ED.  She was seen at [facility] and cleared from an OB perspective.  She was sent to the ED for further evaluation.  She states that her severe chest discomfort has resolved and her symptoms are minor at this time.  She denies any current shortness of breath.  Due to her ongoing symptoms, she was seen by cardiology 1.5 weeks ago.  Cardiology plan was for further work-up following her pregnancy.  Patient denies worsening symptoms with recumbency or improved symptoms with leaning forward.     Home Medications Prior to Admission medications   Medication Sig Start Date End Date Taking? Authorizing Provider  famotidine (PEPCID) 20 MG tablet Take 1 tablet (20 mg total) by mouth 2 (two) times daily. 06/06/22  Yes Leftwich-Kirby, Kathie Dike, CNM  Prenatal  Vit-Fe Fumarate-FA (PRENATAL VITAMIN) 27-0.8 MG TABS Take 1 tablet by mouth daily in the afternoon. 05/05/22  Yes Megan Salon, MD  aspirin EC 81 MG tablet Take 1 tablet (81 mg total) by mouth daily. Swallow whole. Patient not taking: Reported on 07/07/2022 07/07/22   Megan Salon, MD      Allergies    Patient has no known allergies.    Review of Systems   Review of Systems  Respiratory:  Positive for shortness of breath.   Cardiovascular:  Positive for chest pain.  All other systems reviewed and are negative.   Physical Exam Updated Vital Signs BP 127/62 (BP Location: Left Arm)   Pulse 97   Temp 98.6 F (37 C)   Resp 16   Ht 5\' 3"  (1.6 m)   Wt 73.5 kg   SpO2 100%   BMI 28.70 kg/m  Physical Exam Vitals and nursing note reviewed.  Constitutional:      General: She is not in acute distress.    Appearance: She is well-developed and normal weight. She is not ill-appearing, toxic-appearing or diaphoretic.  HENT:     Head: Normocephalic and atraumatic.     Mouth/Throat:     Mouth: Mucous membranes are moist.     Pharynx: Oropharynx is clear.  Eyes:     Conjunctiva/sclera: Conjunctivae normal.  Neck:     Vascular: No JVD.  Cardiovascular:     Rate and  Rhythm: Normal rate and regular rhythm.     Heart sounds: No murmur heard. Pulmonary:     Effort: Pulmonary effort is normal. No tachypnea, accessory muscle usage or respiratory distress.     Breath sounds: No wheezing, rhonchi or rales.  Chest:     Chest wall: No tenderness.  Abdominal:     Palpations: Abdomen is soft.     Tenderness: There is no abdominal tenderness.  Musculoskeletal:        General: No swelling.     Cervical back: Normal range of motion and neck supple.     Right lower leg: No edema.     Left lower leg: No edema.  Skin:    General: Skin is warm and dry.     Coloration: Skin is not cyanotic or pale.  Neurological:     General: No focal deficit present.     Mental Status: She is alert and oriented  to person, place, and time.  Psychiatric:        Mood and Affect: Mood normal.        Behavior: Behavior normal.     ED Results / Procedures / Treatments   Labs (all labs ordered are listed, but only abnormal results are displayed) Labs Reviewed  BASIC METABOLIC PANEL - Abnormal; Notable for the following components:      Result Value   CO2 21 (*)    BUN <5 (*)    All other components within normal limits  CBC - Abnormal; Notable for the following components:   RBC 3.51 (*)    Hemoglobin 10.5 (*)    HCT 31.4 (*)    All other components within normal limits  HEPATIC FUNCTION PANEL - Abnormal; Notable for the following components:   Albumin 3.2 (*)    Total Bilirubin <0.1 (*)    All other components within normal limits  TROPONIN I (HIGH SENSITIVITY)  TROPONIN I (HIGH SENSITIVITY)    EKG None  Radiology DG Chest 2 View  Result Date: 07/16/2022 CLINICAL DATA:  Chest heaviness and shortness of breath EXAM: CHEST - 2 VIEW COMPARISON:  Chest x-ray August 15, 2014 FINDINGS: The cardiomediastinal silhouette is unchanged in contour. No focal pulmonary opacity. No pleural effusion or pneumothorax. The visualized upper abdomen is unremarkable. No acute osseous abnormality. IMPRESSION: No acute cardiopulmonary abnormality. Electronically Signed   By: Jacob Moores M.D.   On: 07/16/2022 08:26    Procedures Procedures    Medications Ordered in ED Medications - No data to display  ED Course/ Medical Decision Making/ A&P                           Medical Decision Making  This patient presents to the ED for concern of chest pain, this involves an extensive number of treatment options, and is a complaint that carries with it a high risk of complications and morbidity.  The differential diagnosis includes ACS, GERD, pericarditis, costochondritis, PE, anxiety   Co morbidities that complicate the patient evaluation  Current pregnancy   Additional history  obtained:  Additional history obtained from N/A External records from outside source obtained and reviewed including EMR   Lab Tests:  I Ordered, and personally interpreted labs.  The pertinent results include: Mild decrease in baseline hemoglobin, consistent with pregnancy; normal electrolytes, normal troponins x2   Imaging Studies ordered:  I ordered imaging studies including chest x-ray I independently visualized and interpreted imaging which showed no acute  findings I agree with the radiologist interpretation   Cardiac Monitoring: / EKG:  The patient was maintained on a cardiac monitor.  I personally viewed and interpreted the cardiac monitored which showed an underlying rhythm of: Sinus rhythm  Problem List / ED Course / Critical interventions / Medication management  Patient is a 31 year old female, currently [redacted] weeks pregnant, presenting for chest pain.  Chest pain has been intermittent over the past 5 weeks.  It does seem to be worse at night.  She was started on famotidine with significant improvement in her symptoms.  Despite daily famotidine, she did have an episode of severe chest pain last night.  This prompted her to come to the ED.  EKG shows no changes from prior tracing.  Lab work, obtained prior to patient being bedded in the ED is reassuring with normal electrolytes and normal troponins.  On assessment, patient is well-appearing.  Vital signs are normal.  No rubs or murmurs are appreciated on cardiac auscultation.  She does report that her symptoms are minimal at this time.  On bedside ultrasound, there is a very trace pericardial effusion.  This raises concern for possible pericarditis, however, symptoms patient describes are more consistent with GERD.  Patient is currently being treated for this with Pepcid alone.  She was advised to take Tums as needed for further symptomatic relief.  She was also advised to continue to follow-up with cardiology.  If symptoms are  persistent or worsening in severity, patient could be started on low-dose steroids for treatment of pericarditis.  Given her description of symptoms at this time, I do not feel that this is warranted during this visit.  Patient is stable for discharge at this time.   Social Determinants of Health:  Has access to outpatient care         Final Clinical Impression(s) / ED Diagnoses Final diagnoses:  Chest pain, unspecified type  [redacted] weeks gestation of pregnancy    Rx / DC Orders ED Discharge Orders     None         Godfrey Pick, MD 07/16/22 1428

## 2022-07-20 NOTE — Progress Notes (Deleted)
Cardio-Obstetrics Clinic  Follow-up Evaluation  Date:  07/20/2022   ID:  Rachael [name], DOB 05-16-1991, MRN [id number]  PCP:  Patient, No Pcp Per   [facility] Providers Cardiologist:  Meriam Sprague, MD  Electrophysiologist:  None     Referring MD: No ref. provider found   Chief Complaint: chest pressure  History of Present Illness:    Rachael [name] is a 31 y.o. female [G3P0020] who presents to clinic for follow-up of chest pain.  Was last seen in clinic 07/07/22 where she complained of chest heaviness that worsened when laying flat and improved when sitting up and taking an antiacid. Symptoms thought to be due to GERD but given her family history, she is scheduled for an echo.  Was seen in the ER 07/16/22 for chest pain. Trop negative x2. ECG nonischemic. CXR without acute pathology. She is now presenting for follow-up.  Today, ***   Prior CV Studies Reviewed: The following studies were reviewed today:   Past Medical History:  Diagnosis Date   Difficult intubation 08/15/14   Heart murmur    Nonsmoker     Past Surgical History:  Procedure Laterality Date   COSMETIC SURGERY     WISDOM TOOTH EXTRACTION      OB History     Gravida  3   Para  0   Term      Preterm      AB  2   Living         SAB  1   IAB  1   Ectopic      Multiple      Live Births                  Current Medications: No outpatient medications have been marked as taking for the 07/21/22 encounter (Appointment) with Meriam Sprague, MD.     Allergies:   Patient has no known allergies.   Social History   Socioeconomic History   Marital status: Single    Spouse name: Not on file   Number of children: Not on file   Years of education: Not on file   Highest education level: Not on file  Occupational History   Not on file  Tobacco Use   Smoking status: Never   Smokeless tobacco: Never  Vaping Use   Vaping Use: Never used  Substance and  Sexual Activity   Alcohol use: Not Currently   Drug use: Never   Sexual activity: Yes    Birth control/protection: None  Other Topics Concern   Not on file  Social History Narrative   Lives with friend. Family lives nearby in [place]   Social Determinants of Health   Financial Resource Strain: Low Risk  (05/03/2022)   Overall Financial Resource Strain (CARDIA)    Difficulty of Paying Living Expenses: Not hard at all  Food Insecurity: No Food Insecurity (05/03/2022)   Hunger Vital Sign    Worried About Running Out of Food in the Last Year: Never true    Ran Out of Food in the Last Year: Never true  Transportation Needs: No Transportation Needs (05/03/2022)   PRAPARE - Administrator, Civil Service (Medical): No    Lack of Transportation (Non-Medical): No  Physical Activity: Insufficiently Active (05/03/2022)   Exercise Vital Sign    Days of Exercise per Week: 3 days    Minutes of Exercise per Session: 20 min  Stress: No Stress Concern Present (05/03/2022)   [place]  [employer] - Occupational Stress Questionnaire    Feeling of Stress : Only a little  Social Connections: Socially Isolated (05/03/2022)   Social Connection and Isolation Panel [NHANES]    Frequency of Communication with Friends and Family: Three times a week    Frequency of Social Gatherings with Friends and Family: Twice a week    Attends Religious Services: Never    Database administrator or Organizations: No    Attends Engineer, structural: Never    Marital Status: Never married      Family History  Problem Relation Age of Onset   Asthma Mother    Hypertension Mother    Sleep apnea Mother    Heart disease Father    Heart attack Father        Sounds like arrythemia; died in 30s   Heart murmur Sister    Heart murmur Brother    Cancer Paternal Grandmother        breast cancer   Diabetes Neg Hx       ROS:   Please see the history of present illness.    All other  systems reviewed and are negative.   Labs/EKG Reviewed:    EKG:   EKG not performed today  Recent Labs: 07/16/2022: ALT 27; BUN <5; Creatinine, Ser 0.64; Hemoglobin 10.5; Platelets 223; Potassium 3.7; Sodium 135   Recent Lipid Panel No results found for: "CHOL", "TRIG", "HDL", "CHOLHDL", "LDLCALC", "LDLDIRECT"  Physical Exam:    VS:  There were no vitals taken for this visit.    Wt Readings from Last 3 Encounters:  07/16/22 162 lb (73.5 kg)  07/07/22 161 lb (73 kg)  07/05/22 158 lb 12.8 oz (72 kg)     GEN:  Well nourished, well developed in no acute distress HEENT: Normal NECK: No JVD; No carotid bruits CARDIAC: RRR, 2/6 systolic murmur at RUSB RESPIRATORY:  Clear to auscultation without rales, wheezing or rhonchi  ABDOMEN: Gravid, soft MUSCULOSKELETAL:  No edema; No deformity  SKIN: Warm and dry NEUROLOGIC:  Alert and oriented x 3 PSYCHIATRIC:  Normal affect    Risk Assessment/Risk Calculators:     ASSESSMENT & PLAN:    #Chest Pain: Likely GERD as symptoms only occur with laying down at night and have improved with tums. No exertional symptoms, however, given strong family history of CAD (father passed away at 60 from MI), will check TTE to assess further. Once she is no longer pregnant, can proceed with NMR, Lp(a), apolipoprotein B testing as well as Ca scoring.  -Check TTE -Plan to check NMR, Lp(a), apolipoprotein B,  Ca scoring once she is no longer pregnant  There are no Patient Instructions on file for this visit.   Dispo:  No follow-ups on file.   Medication Adjustments/Labs and Tests Ordered: Current medicines are reviewed at length with the patient today.  Concerns regarding medicines are outlined above.  Tests Ordered: No orders of the defined types were placed in this encounter.  Medication Changes: No orders of the defined types were placed in this encounter.

## 2022-07-21 ENCOUNTER — Ambulatory Visit: Payer: No Typology Code available for payment source | Admitting: Cardiology

## 2022-07-25 ENCOUNTER — Ambulatory Visit: Payer: No Typology Code available for payment source | Admitting: Cardiology

## 2022-08-01 ENCOUNTER — Ambulatory Visit (HOSPITAL_COMMUNITY): Payer: No Typology Code available for payment source | Attending: Cardiology

## 2022-08-01 DIAGNOSIS — R079 Chest pain, unspecified: Secondary | ICD-10-CM | POA: Diagnosis present

## 2022-08-01 LAB — ECHOCARDIOGRAM COMPLETE
Area-P 1/2: 3.65 cm2
S' Lateral: 2.1 cm

## 2022-08-03 ENCOUNTER — Encounter (HOSPITAL_BASED_OUTPATIENT_CLINIC_OR_DEPARTMENT_OTHER): Payer: Self-pay | Admitting: Obstetrics & Gynecology

## 2022-08-03 ENCOUNTER — Ambulatory Visit (INDEPENDENT_AMBULATORY_CARE_PROVIDER_SITE_OTHER): Payer: No Typology Code available for payment source | Admitting: Obstetrics & Gynecology

## 2022-08-03 ENCOUNTER — Ambulatory Visit: Payer: No Typology Code available for payment source | Attending: Maternal & Fetal Medicine

## 2022-08-03 ENCOUNTER — Ambulatory Visit: Payer: No Typology Code available for payment source | Admitting: *Deleted

## 2022-08-03 VITALS — BP 122/51 | HR 81

## 2022-08-03 VITALS — BP 135/58 | HR 72 | Wt 166.6 lb

## 2022-08-03 DIAGNOSIS — Z362 Encounter for other antenatal screening follow-up: Secondary | ICD-10-CM | POA: Diagnosis present

## 2022-08-03 DIAGNOSIS — Z3A23 23 weeks gestation of pregnancy: Secondary | ICD-10-CM

## 2022-08-03 DIAGNOSIS — Z148 Genetic carrier of other disease: Secondary | ICD-10-CM

## 2022-08-03 DIAGNOSIS — D563 Thalassemia minor: Secondary | ICD-10-CM

## 2022-08-03 DIAGNOSIS — Z3480 Encounter for supervision of other normal pregnancy, unspecified trimester: Secondary | ICD-10-CM | POA: Diagnosis present

## 2022-08-03 DIAGNOSIS — O321XX0 Maternal care for breech presentation, not applicable or unspecified: Secondary | ICD-10-CM | POA: Insufficient documentation

## 2022-08-03 DIAGNOSIS — M545 Low back pain, unspecified: Secondary | ICD-10-CM

## 2022-08-03 DIAGNOSIS — G8929 Other chronic pain: Secondary | ICD-10-CM

## 2022-08-03 DIAGNOSIS — O99891 Other specified diseases and conditions complicating pregnancy: Secondary | ICD-10-CM

## 2022-08-03 MED ORDER — ASPIRIN 81 MG PO TBEC: 81.0000 mg | DELAYED_RELEASE_TABLET | Freq: Every day | ORAL | 12 refills | Status: DC

## 2022-08-06 NOTE — Progress Notes (Signed)
   PRENATAL VISIT NOTE  Subjective:  Rachael [name] is a 31 y.o. G3P0020 at [redacted]w[redacted]d being seen today for ongoing prenatal care.  She is currently monitored for the following issues for this low-risk pregnancy and has Supervision of other normal pregnancy, antepartum and Alpha thalassemia silent carrier on their problem list.  Patient reports  low back issues that have worsened.  No pain across buttocks or down legs.  Is quite uncomfortable at times.  Heat and tylenol discussed.  Sports medicine referral recommended.  Pt would like to proceed with referral .  Contractions: Not present. Vag. Bleeding: None.  Movement: Present. Denies leaking of fluid.   The following portions of the patient's history were reviewed and updated as appropriate: allergies, current medications, past family history, past medical history, past social history, past surgical history and problem list.   Objective:   Vitals:   08/03/22 1619  BP: (!) 135/58  Pulse: 72  Weight: 166 lb 9.6 oz (75.6 kg)    Fetal Status: Fetal Heart Rate (bpm): 138 Fundal Height: 24 cm Movement: Present     General:  Alert, oriented and cooperative. Patient is in no acute distress.  Skin: Skin is warm and dry. No rash noted.   Cardiovascular: Normal heart rate noted  Respiratory: Normal respiratory effort, no problems with respiration noted  Abdomen: Soft, gravid, appropriate for gestational age.  Pain/Pressure: Present     Pelvic: Cervical exam deferred        Extremities: Normal range of motion.  Edema: None  Mental Status: Normal mood and affect. Normal behavior. Normal judgment and thought content.   Assessment and Plan:  Pregnancy: G3P0020 at [redacted]w[redacted]d 1. Supervision of other normal pregnancy, antepartum - on PNV.  Baby ASA recommended.  She will start this.  2. Chronic bilateral low back pain without sciatica - AMB referral to sports medicine  3. Alpha thalassemia silent carrier - FOB declined testing  4. [redacted] weeks gestation of  pregnancy - Return 4 weeks.  GTT/CBC/RPR/HIV, Tdap will be done at this next appt.    Preterm labor symptoms and general obstetric precautions including but not limited to vaginal bleeding, contractions, leaking of fluid and fetal movement were reviewed in detail with the patient. Please refer to After Visit Summary for other counseling recommendations.   Return in about 4 weeks (around 08/31/2022).  Future Appointments  Date Time Provider Department Center  08/31/2022  9:35 AM Jerene Bears, MD [facility]  09/15/2022  4:00 PM Meriam Sprague, MD [facility] None  09/20/2022  4:15 PM Jerene Bears, MD [facility]  10/05/2022  3:35 PM Jerene Bears, MD [facility]  10/19/2022  8:35 AM Jerene Bears, MD [facility]  11/02/2022  3:55 PM Jerene Bears, MD [facility]  11/09/2022  8:35 AM Jerene Bears, MD [facility]  11/16/2022  3:55 PM Jerene Bears, MD [facility]  11/23/2022  8:15 AM Jerene Bears, MD [facility]    Jerene Bears, MD

## 2022-08-11 ENCOUNTER — Ambulatory Visit: Payer: No Typology Code available for payment source | Admitting: Cardiology

## 2022-08-14 ENCOUNTER — Encounter: Payer: Self-pay | Admitting: Family Medicine

## 2022-08-31 ENCOUNTER — Other Ambulatory Visit (HOSPITAL_COMMUNITY)
Admission: RE | Admit: 2022-08-31 | Discharge: 2022-08-31 | Disposition: A | Discharge status: Home / Self Care | Payer: No Typology Code available for payment source | Source: Ambulatory Visit | Attending: Obstetrics & Gynecology | Admitting: Obstetrics & Gynecology

## 2022-08-31 ENCOUNTER — Ambulatory Visit (INDEPENDENT_AMBULATORY_CARE_PROVIDER_SITE_OTHER): Payer: No Typology Code available for payment source | Admitting: Obstetrics & Gynecology

## 2022-08-31 VITALS — BP 130/57 | HR 77 | Wt 164.2 lb

## 2022-08-31 DIAGNOSIS — D563 Thalassemia minor: Secondary | ICD-10-CM

## 2022-08-31 DIAGNOSIS — B3731 Acute candidiasis of vulva and vagina: Secondary | ICD-10-CM

## 2022-08-31 DIAGNOSIS — N760 Acute vaginitis: Secondary | ICD-10-CM

## 2022-08-31 DIAGNOSIS — O99013 Anemia complicating pregnancy, third trimester: Secondary | ICD-10-CM

## 2022-08-31 DIAGNOSIS — N898 Other specified noninflammatory disorders of vagina: Secondary | ICD-10-CM

## 2022-08-31 DIAGNOSIS — Z3A27 27 weeks gestation of pregnancy: Secondary | ICD-10-CM

## 2022-08-31 DIAGNOSIS — Z3482 Encounter for supervision of other normal pregnancy, second trimester: Secondary | ICD-10-CM | POA: Diagnosis not present

## 2022-08-31 DIAGNOSIS — Z23 Encounter for immunization: Secondary | ICD-10-CM

## 2022-08-31 DIAGNOSIS — B9689 Other specified bacterial agents as the cause of diseases classified elsewhere: Secondary | ICD-10-CM

## 2022-08-31 DIAGNOSIS — Z3480 Encounter for supervision of other normal pregnancy, unspecified trimester: Secondary | ICD-10-CM

## 2022-08-31 NOTE — Progress Notes (Signed)
   PRENATAL VISIT NOTE  Subjective:  Rachael [name] is a 31 y.o. G3P0020 at [redacted]w[redacted]d being seen today for ongoing prenatal care.  She is currently monitored for the following issues for this low-risk pregnancy and has Supervision of other normal pregnancy, antepartum and Alpha thalassemia silent carrier on their problem list.  Patient reports  recurrent vaginal itching.  Feels like she has yeast infections.  Will do self swab today .  Contractions: Not present. Vag. Bleeding: None.  Movement: Present. Denies leaking of fluid.   The following portions of the patient's history were reviewed and updated as appropriate: allergies, current medications, past family history, past medical history, past social history, past surgical history and problem list.   Objective:   Vitals:   08/31/22 0935  BP: (!) 130/57  Pulse: 77  Weight: 164 lb 3.2 oz (74.5 kg)    Fetal Status: Fetal Heart Rate (bpm): 133   Movement: Present     General:  Alert, oriented and cooperative. Patient is in no acute distress.  Skin: Skin is warm and dry. No rash noted.   Cardiovascular: Normal heart rate noted  Respiratory: Normal respiratory effort, no problems with respiration noted  Abdomen: Soft, gravid, appropriate for gestational age.  Pain/Pressure: Present     Pelvic: Cervical exam deferred        Extremities: Normal range of motion.  Edema: Trace  Mental Status: Normal mood and affect. Normal behavior. Normal judgment and thought content.   Assessment and Plan:  Pregnancy: G3P0020 at [redacted]w[redacted]d 1. Encounter for supervision of other normal pregnancy in second trimester - on PNV and baby ASA - recheck 4 weeks - CBC - Glucose Tolerance, 2 Hours w/1 Hour - HIV Antibody (routine testing w rflx) - RPR - tdap given today  2. Alpha thalassemia silent carrier - partner declines testing  3. [redacted] weeks gestation of pregnancy  4. Vaginal itching - Cervicovaginal ancillary only( [facility])   Preterm labor symptoms  and general obstetric precautions including but not limited to vaginal bleeding, contractions, leaking of fluid and fetal movement were reviewed in detail with the patient. Please refer to After Visit Summary for other counseling recommendations.   Return in about 4 weeks (around 09/28/2022).  Future Appointments  Date Time Provider Department Center  09/15/2022  4:00 PM Meriam Sprague, MD [facility] None  09/20/2022  4:15 PM Jerene Bears, MD [facility]  10/05/2022  3:35 PM Jerene Bears, MD [facility]  10/19/2022  8:35 AM Jerene Bears, MD [facility]  11/02/2022  3:55 PM Jerene Bears, MD [facility]  11/09/2022  8:35 AM Jerene Bears, MD [facility]  11/16/2022  3:55 PM Jerene Bears, MD [facility]  11/23/2022  8:15 AM Jerene Bears, MD [facility]    Jerene Bears, MD

## 2022-08-31 NOTE — Progress Notes (Signed)
130/57 

## 2022-09-01 DIAGNOSIS — O99013 Anemia complicating pregnancy, third trimester: Secondary | ICD-10-CM | POA: Insufficient documentation

## 2022-09-01 LAB — CERVICOVAGINAL ANCILLARY ONLY
Bacterial Vaginitis (gardnerella): POSITIVE — AB
Candida Glabrata: NEGATIVE
Candida Vaginitis: POSITIVE — AB
Chlamydia: NEGATIVE
Comment: NEGATIVE
Comment: NEGATIVE
Comment: NEGATIVE
Comment: NEGATIVE
Comment: NEGATIVE
Comment: NORMAL
Neisseria Gonorrhea: NEGATIVE
Trichomonas: NEGATIVE

## 2022-09-01 LAB — GLUCOSE TOLERANCE, 2 HOURS W/ 1HR
Glucose, 1 hour: 124 mg/dL (ref 70–179)
Glucose, 2 hour: 104 mg/dL (ref 70–152)
Glucose, Fasting: 75 mg/dL (ref 70–91)

## 2022-09-01 LAB — CBC
Hematocrit: 32.5 % — ABNORMAL LOW (ref 34.0–46.6)
Hemoglobin: 10.2 g/dL — ABNORMAL LOW (ref 11.1–15.9)
MCH: 28.3 pg (ref 26.6–33.0)
MCHC: 31.4 g/dL — ABNORMAL LOW (ref 31.5–35.7)
MCV: 90 fL (ref 79–97)
Platelets: 262 10*3/uL (ref 150–450)
RBC: 3.61 x10E6/uL — ABNORMAL LOW (ref 3.77–5.28)
RDW: 12.6 % (ref 11.7–15.4)
WBC: 8.1 10*3/uL (ref 3.4–10.8)

## 2022-09-01 LAB — HIV ANTIBODY (ROUTINE TESTING W REFLEX): HIV Screen 4th Generation wRfx: NONREACTIVE

## 2022-09-01 LAB — RPR: RPR Ser Ql: NONREACTIVE

## 2022-09-01 MED ORDER — TERCONAZOLE 0.4 % VA CREA: 1.0000 | TOPICAL_CREAM | Freq: Every day | VAGINAL | 0 refills | Status: DC

## 2022-09-01 MED ORDER — METRONIDAZOLE 500 MG PO TABS: 500.0000 mg | ORAL_TABLET | Freq: Two times a day (BID) | ORAL | 0 refills | Status: DC

## 2022-09-01 NOTE — Addendum Note (Signed)
Addended by: Jerene Bears on: 09/01/2022 01:18 PM   Modules accepted: Orders

## 2022-09-06 ENCOUNTER — Other Ambulatory Visit (HOSPITAL_COMMUNITY): Payer: Self-pay

## 2022-09-11 NOTE — Progress Notes (Deleted)
[facility]io[facility]stetrics [facility]  Follow-up Evaluation  Date:  09/11/2022   ID:  Rachael [name], DOB 04-21-1991, MRN [id number]  PCP:  Patient, No Pcp Per   [facility] Providers Cardiologist:  Freada Bergeron, MD  Electrophysiologist:  None     Referring MD: No ref. provider found   Chief Complaint: chest pressure  History of Present Illness:    Rachael [name] is a 32 y.o. female [id number] who presents for follow-up of chest pressure.   Patient initially seen on 07/07/22 for chest pressure that worsened while laying flat and improved with placing a heating pad on her chest and taking a antiacid. TTE 19-Jul-2022 with EF 70-75%, normal RV, no significant valve disease.   Today, ***     Prior CV Studies Reviewed: The following studies were reviewed today: TTE 07-19-2022: IMPRESSIONS     1. Left ventricular ejection fraction, by estimation, is 70 to 75%. The  left ventricle has hyperdynamic function. The left ventricle has no  regional wall motion abnormalities. Left ventricular diastolic parameters  were normal. The average left  ventricular global longitudinal strain is -28.1 %. The global longitudinal  strain is normal.   2. Right ventricular systolic function is normal. The right ventricular  size is normal. There is normal pulmonary artery systolic pressure.   3. There is no evidence of cardiac tamponade.   4. The mitral valve is normal in structure. Trivial mitral valve  regurgitation. No evidence of mitral stenosis.   5. The aortic valve is tricuspid. Aortic valve regurgitation is not  visualized. No aortic stenosis is present.   6. The inferior vena cava is normal in size with greater than 50%  respiratory variability, suggesting right atrial pressure of 3 mmHg.   Past Medical History:  Diagnosis Date   Difficult intubation 08/15/2014   Heart murmur     Past Surgical History:  Procedure Laterality Date   COSMETIC SURGERY     WISDOM TOOTH EXTRACTION       OB History     Gravida  3   Para  0   Term      Preterm      AB  2   Living         SAB  1   IAB  1   Ectopic      Multiple      Live Births                  Current Medications: No outpatient medications have been marked as taking for the 09/15/22 encounter (Appointment) with Freada Bergeron, MD.     Allergies:   Patient has no known allergies.   Social History   Socioeconomic History   Marital status: Single    Spouse name: Not on file   Number of children: Not on file   Years of education: Not on file   Highest education level: Not on file  Occupational History   Not on file  Tobacco Use   Smoking status: Never   Smokeless tobacco: Never  Vaping Use   Vaping Use: Never used  Substance and Sexual Activity   Alcohol use: Not Currently   Drug use: Never   Sexual activity: Yes    Birth control/protection: None  Other Topics Concern   Not on file  Social History Narrative   Lives with friend. Family lives nearby in [place] Determinants of Health   Financial Resource Strain: Low Risk  (05/03/2022)   Overall  Financial Resource Strain (CARDIA)    Difficulty of Paying Living Expenses: Not hard at all  Food Insecurity: No Food Insecurity (05/03/2022)   Hunger Vital Sign    Worried About Running Out of Food in the Last Year: Never true    Ran Out of Food in the Last Year: Never true  Transportation Needs: No Transportation Needs (05/03/2022)   PRAPARE - Hydrologist (Medical): No    Lack of Transportation (Non-Medical): No  Physical Activity: Insufficiently Active (05/03/2022)   Exercise Vital Sign    Days of Exercise per Week: 3 days    Minutes of Exercise per Session: 20 min  Stress: No Stress Concern Present (05/03/2022)   [place]    Feeling of Stress : Only a little  Social Connections: Socially Isolated (05/03/2022)   Social  Connection and Isolation Panel [NHANES]    Frequency of Communication with Friends and Family: Three times a week    Frequency of Social Gatherings with Friends and Family: Twice a week    Attends Religious Services: Never    Marine scientist or Organizations: No    Attends Music therapist: Never    Marital Status: Never married      Family History  Problem Relation Age of Onset   Asthma Mother    Hypertension Mother    Sleep apnea Mother    Heart disease Father    Heart attack Father        Sounds like arrythemia; died in 68s   Heart murmur Sister    Heart murmur Brother    Cancer Paternal Grandmother        breast cancer   Diabetes Neg Hx       ROS:   Please see the history of present illness.    All other systems reviewed and are negative.   Labs/EKG Reviewed:    EKG:   EKG is  ordered today.  The ekg ordered today demonstrates sinus tach with HR 101  Recent Labs: 07/16/2022: ALT 27; BUN <5; Creatinine, Ser 0.64; Potassium 3.7; Sodium 135 08/31/2022: Hemoglobin 10.2; Platelets 262   Recent Lipid Panel No results found for: "CHOL", "TRIG", "HDL", "CHOLHDL", "LDLCALC", "LDLDIRECT"  Physical Exam:    VS:  There were no vitals taken for this visit.    Wt Readings from Last 3 Encounters:  08/31/22 164 lb 3.2 oz (74.5 kg)  08/03/22 166 lb 9.6 oz (75.6 kg)  07/16/22 162 lb (73.5 kg)     GEN:  Well nourished, well developed in no acute distress HEENT: Normal NECK: No JVD; No carotid bruits CARDIAC: RRR, 2/6 systolic murmur at RUSB RESPIRATORY:  Clear to auscultation without rales, wheezing or rhonchi  ABDOMEN: Gravid, soft MUSCULOSKELETAL:  No edema; No deformity  SKIN: Warm and dry NEUROLOGIC:  Alert and oriented x 3 PSYCHIATRIC:  Normal affect    Risk Assessment/Risk Calculators:     ASSESSMENT & PLAN:    #Chest Pain: Likely GERD as symptoms only occur with laying down at night and have improved with tums. No exertional symptoms.  TTE with EF 70-75%, no valve disease. Once she is no longer pregnant, can proceed with NMR, Lp(a), apolipoprotein B testing as well as Ca scoring.  -Check TTE -Plan to check NMR, Lp(a), apolipoprotein B,  Ca scoring once she is no longer pregnant  There are no Patient Instructions on file for this visit.   Dispo:  No follow-ups on file.   Medication Adjustments/Labs and Tests Ordered: Current medicines are reviewed at length with the patient today.  Concerns regarding medicines are outlined above.  Tests Ordered: No orders of the defined types were placed in this encounter.  Medication Changes: No orders of the defined types were placed in this encounter.

## 2022-09-13 ENCOUNTER — Ambulatory Visit: Payer: No Typology Code available for payment source | Admitting: Cardiology

## 2022-09-15 ENCOUNTER — Ambulatory Visit: Payer: Self-pay | Admitting: Cardiology

## 2022-09-20 ENCOUNTER — Ambulatory Visit (INDEPENDENT_AMBULATORY_CARE_PROVIDER_SITE_OTHER): Payer: Self-pay | Admitting: Obstetrics & Gynecology

## 2022-09-20 ENCOUNTER — Encounter (HOSPITAL_BASED_OUTPATIENT_CLINIC_OR_DEPARTMENT_OTHER): Payer: Self-pay | Admitting: Obstetrics & Gynecology

## 2022-09-20 VITALS — BP 123/67 | HR 67 | Wt 169.8 lb

## 2022-09-20 DIAGNOSIS — Z3403 Encounter for supervision of normal first pregnancy, third trimester: Secondary | ICD-10-CM

## 2022-09-20 DIAGNOSIS — D563 Thalassemia minor: Secondary | ICD-10-CM

## 2022-09-20 DIAGNOSIS — O99013 Anemia complicating pregnancy, third trimester: Secondary | ICD-10-CM

## 2022-09-21 ENCOUNTER — Encounter (HOSPITAL_BASED_OUTPATIENT_CLINIC_OR_DEPARTMENT_OTHER): Payer: Self-pay | Admitting: Obstetrics & Gynecology

## 2022-09-21 ENCOUNTER — Other Ambulatory Visit (HOSPITAL_BASED_OUTPATIENT_CLINIC_OR_DEPARTMENT_OTHER): Payer: Self-pay

## 2022-09-21 MED ORDER — ABRYSVO 120 MCG/0.5ML IM SOLR
0.5000 mL | Freq: Once | INTRAMUSCULAR | 0 refills | Status: AC
  Filled 2022-09-21 – 2022-10-05 (×2): qty 0.5, 1d supply, fill #0

## 2022-09-21 NOTE — Progress Notes (Signed)
   PRENATAL VISIT NOTE  Subjective:  Rachael [name] is a 32 y.o. G3P0020 at [redacted]w[redacted]d being seen today for ongoing prenatal care.  She is currently monitored for the following issues for this low-risk pregnancy and has Supervision of normal pregnancy; Alpha thalassemia silent carrier; and Anemia affecting pregnancy in third trimester on their problem list.  Patient reports no complaints.  Contractions: Irregular. Vag. Bleeding: None.  Movement: Present. Denies leaking of fluid.   The following portions of the patient's history were reviewed and updated as appropriate: allergies, current medications, past family history, past medical history, past social history, past surgical history and problem list.   Objective:   Vitals:   09/20/22 1652  BP: 123/67  Pulse: 67  Weight: 169 lb 12.8 oz (77 kg)    Fetal Status: Fetal Heart Rate (bpm): 132 Fundal Height: 30 cm Movement: Present     General:  Alert, oriented and cooperative. Patient is in no acute distress.  Skin: Skin is warm and dry. No rash noted.   Cardiovascular: Normal heart rate noted  Respiratory: Normal respiratory effort, no problems with respiration noted  Abdomen: Soft, gravid, appropriate for gestational age.  Pain/Pressure: Absent     Pelvic: Cervical exam deferred        Extremities: Normal range of motion.  Edema: None  Mental Status: Normal mood and affect. Normal behavior. Normal judgment and thought content.   Assessment and Plan:  Pregnancy: [id number] at [redacted]w[redacted]d 1. Encounter for supervision of normal first pregnancy in third trimester - on PNV and baby ASA - recheck 2 weeks - pt desires RSV vaccine.  Order placed for pt to get in pharmacy prior to next appointment  2. Alpha thalassemia silent carrier - FOB declined testing  3. Anemia affecting pregnancy in third trimester - on oral iron  Preterm labor symptoms and general obstetric precautions including but not limited to vaginal bleeding, contractions, leaking of  fluid and fetal movement were reviewed in detail with the patient. Please refer to After Visit Summary for other counseling recommendations.   Return in about 2 weeks (around 10/04/2022).  Future Appointments  Date Time Provider [place]  10/05/2022  3:35 PM Megan Salon, MD [facility]  10/19/2022  8:35 AM Megan Salon, MD [facility]  11/02/2022  3:55 PM Megan Salon, MD [facility]  11/09/2022  8:35 AM Megan Salon, MD [facility]  11/16/2022  3:55 PM Megan Salon, MD [facility]  11/23/2022  8:15 AM Megan Salon, MD [facility]    Megan Salon, MD

## 2022-10-05 ENCOUNTER — Ambulatory Visit (INDEPENDENT_AMBULATORY_CARE_PROVIDER_SITE_OTHER): Payer: 59 | Admitting: Obstetrics & Gynecology

## 2022-10-05 ENCOUNTER — Other Ambulatory Visit (HOSPITAL_BASED_OUTPATIENT_CLINIC_OR_DEPARTMENT_OTHER): Payer: Self-pay

## 2022-10-05 VITALS — BP 125/66 | HR 65 | Wt 175.6 lb

## 2022-10-05 DIAGNOSIS — Z3403 Encounter for supervision of normal first pregnancy, third trimester: Secondary | ICD-10-CM

## 2022-10-05 DIAGNOSIS — Z3A32 32 weeks gestation of pregnancy: Secondary | ICD-10-CM

## 2022-10-05 DIAGNOSIS — D563 Thalassemia minor: Secondary | ICD-10-CM

## 2022-10-05 DIAGNOSIS — O99013 Anemia complicating pregnancy, third trimester: Secondary | ICD-10-CM

## 2022-10-06 ENCOUNTER — Other Ambulatory Visit (HOSPITAL_BASED_OUTPATIENT_CLINIC_OR_DEPARTMENT_OTHER): Payer: Self-pay

## 2022-10-08 NOTE — Progress Notes (Signed)
   PRENATAL VISIT NOTE  Subjective:  Rachael [name] is a 32 y.o. G3P0020 at [redacted]w[redacted]d being seen today for ongoing prenatal care.  She is currently monitored for the following issues for this low-risk pregnancy and has Supervision of normal pregnancy; Alpha thalassemia silent carrier; and Anemia affecting pregnancy in third trimester on their problem list.  Patient reports  getting tired more easily .  Contractions: Not present. Vag. Bleeding: None.  Movement: Present. Denies leaking of fluid.   The following portions of the patient's history were reviewed and updated as appropriate: allergies, current medications, past family history, past medical history, past social history, past surgical history and problem list.   Objective:   Vitals:   10/05/22 1548  BP: 125/66  Pulse: 65  Weight: 175 lb 9.6 oz (79.7 kg)    Fetal Status: Fetal Heart Rate (bpm): 135 Fundal Height: 34 cm Movement: Present     General:  Alert, oriented and cooperative. Patient is in no acute distress.  Skin: Skin is warm and dry. No rash noted.   Cardiovascular: Normal heart rate noted  Respiratory: Normal respiratory effort, no problems with respiration noted  Abdomen: Soft, gravid, appropriate for gestational age.  Pain/Pressure: Present     Pelvic: Cervical exam deferred        Extremities: Normal range of motion.  Edema: Trace  Mental Status: Normal mood and affect. Normal behavior. Normal judgment and thought content.   Assessment and Plan:  Pregnancy: [id number] at [redacted]w[redacted]d 1. Encounter for supervision of normal first pregnancy in third trimester - on PNV with iron and baby ASA - recheck 2 weeks  2. Alpha thalassemia silent carrier - FOB declined testing  3. Anemia affecting pregnancy in third trimester - discussed oral iron recommendations  4. [redacted] weeks gestation of pregnancy  Preterm labor symptoms and general obstetric precautions including but not limited to vaginal bleeding, contractions, leaking of  fluid and fetal movement were reviewed in detail with the patient. Please refer to After Visit Summary for other counseling recommendations.   Return in about 2 weeks (around 10/19/2022).  Future Appointments  Date Time Provider [place]  10/19/2022  8:35 AM Megan Salon, MD [facility]  11/02/2022  3:55 PM Megan Salon, MD [facility]  11/09/2022  8:35 AM Megan Salon, MD [facility]  11/16/2022  3:55 PM Megan Salon, MD [facility]  11/23/2022  8:15 AM Megan Salon, MD [facility]    Megan Salon, MD

## 2022-10-12 ENCOUNTER — Ambulatory Visit (INDEPENDENT_AMBULATORY_CARE_PROVIDER_SITE_OTHER): Payer: 59 | Admitting: *Deleted

## 2022-10-12 VITALS — BP 138/69 | HR 87

## 2022-10-12 DIAGNOSIS — D563 Thalassemia minor: Secondary | ICD-10-CM

## 2022-10-12 DIAGNOSIS — Z3403 Encounter for supervision of normal first pregnancy, third trimester: Secondary | ICD-10-CM

## 2022-10-12 NOTE — Progress Notes (Signed)
Pt presents to office with complaints of swelling in feet and ankles. Pt with non-pitting edema in lower legs, feet, ankles. Pt complains of headaches the last 2 days, relieved by tylenol. Pt denies vision changes.  Advised pt to wear compression hose, increase her water intake, decrease sodium intake, and to elevate her feet when possible. Advised pt to start checking BP every other day and to notify us if elevated. Pt verbalized understanding. To return next week for routine prenatal visit.

## 2022-10-19 ENCOUNTER — Ambulatory Visit (INDEPENDENT_AMBULATORY_CARE_PROVIDER_SITE_OTHER): Payer: 59 | Admitting: Obstetrics & Gynecology

## 2022-10-19 ENCOUNTER — Other Ambulatory Visit (HOSPITAL_COMMUNITY)
Admission: RE | Admit: 2022-10-19 | Discharge: 2022-10-19 | Disposition: A | Discharge status: Home / Self Care | Payer: 59 | Source: Ambulatory Visit | Attending: Obstetrics & Gynecology | Admitting: Obstetrics & Gynecology

## 2022-10-19 VITALS — BP 152/98 | HR 73 | Wt 183.0 lb

## 2022-10-19 DIAGNOSIS — R030 Elevated blood-pressure reading, without diagnosis of hypertension: Secondary | ICD-10-CM | POA: Diagnosis not present

## 2022-10-19 DIAGNOSIS — Z3A34 34 weeks gestation of pregnancy: Secondary | ICD-10-CM | POA: Insufficient documentation

## 2022-10-19 DIAGNOSIS — Z3483 Encounter for supervision of other normal pregnancy, third trimester: Secondary | ICD-10-CM | POA: Diagnosis not present

## 2022-10-19 DIAGNOSIS — O99013 Anemia complicating pregnancy, third trimester: Secondary | ICD-10-CM

## 2022-10-19 DIAGNOSIS — D563 Thalassemia minor: Secondary | ICD-10-CM

## 2022-10-19 DIAGNOSIS — Z3493 Encounter for supervision of normal pregnancy, unspecified, third trimester: Secondary | ICD-10-CM | POA: Diagnosis not present

## 2022-10-19 MED ORDER — TERCONAZOLE 0.4 % VA CREA: 1.0000 | TOPICAL_CREAM | Freq: Every day | VAGINAL | 0 refills | Status: DC

## 2022-10-19 NOTE — Progress Notes (Signed)
   PRENATAL VISIT NOTE  Subjective:  Rachael [name] is a 32 y.o. G3P0020 at [redacted]w[redacted]d being seen today for ongoing prenatal care.  She is currently monitored for the following issues for this low-risk pregnancy and has Supervision of normal pregnancy; Alpha thalassemia silent carrier; and Anemia affecting pregnancy in third trimester on their problem list.  Patient reports  some headaches that are relieved by Tylenol.   .  Contractions: Not present. Vag. Bleeding: None.  Movement: Present. Denies leaking of fluid.   The following portions of the patient's history were reviewed and updated as appropriate: allergies, current medications, past family history, past medical history, past social history, past surgical history and problem list.   Objective:   Vitals:   10/19/22 0843 10/19/22 0916  BP: (!) 155/92 (!) 152/98  Pulse: 73   Weight: 183 lb (83 kg)     Fetal Status: Fetal Heart Rate (bpm): 132 Fundal Height: 35 cm Movement: Present     General:  Alert, oriented and cooperative. Patient is in no acute distress.  Skin: Skin is warm and dry. No rash noted.   Cardiovascular: Normal heart rate noted  Respiratory: Normal respiratory effort, no problems with respiration noted  Abdomen: Soft, gravid, appropriate for gestational age.  Pain/Pressure: Absent     Pelvic: Cervical exam deferred        Extremities: Normal range of motion.  Edema: Moderate pitting, indentation subsides rapidly  Mental Status: Normal mood and affect. Normal behavior. Normal judgment and thought content.   Assessment and Plan:  Pregnancy: G3P0020 at [redacted]w[redacted]d 1. [redacted] weeks gestation of pregnancy - Cervicovaginal ancillary only( [place]) - Culture, beta strep (group b only)  2. Elevated blood pressure reading - recheck BP in 24 hours - Protein / creatinine ratio, urine - CBC - Comprehensive metabolic panel  3. Alpha thalassemia silent carrier - FOB declined testing  4. Anemia affecting pregnancy in third  trimester - on PNV with iron  Preterm labor symptoms and general obstetric precautions including but not limited to vaginal bleeding, contractions, leaking of fluid and fetal movement were reviewed in detail with the patient. Please refer to After Visit Summary for other counseling recommendations.   Return in about 1 week (around 10/26/2022) for and bp check in 24 hours.  Future Appointments  Date Time Provider [place]  11/02/2022  3:55 PM Megan Salon, MD [facility]  11/09/2022  8:35 AM Megan Salon, MD [facility]  11/16/2022  3:55 PM Megan Salon, MD [facility]  11/23/2022  8:15 AM Megan Salon, MD [facility]    Megan Salon, MD

## 2022-10-20 ENCOUNTER — Ambulatory Visit (INDEPENDENT_AMBULATORY_CARE_PROVIDER_SITE_OTHER): Payer: 59 | Admitting: Obstetrics & Gynecology

## 2022-10-20 VITALS — BP 146/86

## 2022-10-20 DIAGNOSIS — O133 Gestational [pregnancy-induced] hypertension without significant proteinuria, third trimester: Secondary | ICD-10-CM

## 2022-10-20 DIAGNOSIS — O99013 Anemia complicating pregnancy, third trimester: Secondary | ICD-10-CM

## 2022-10-20 DIAGNOSIS — Z3A34 34 weeks gestation of pregnancy: Secondary | ICD-10-CM

## 2022-10-20 DIAGNOSIS — O0993 Supervision of high risk pregnancy, unspecified, third trimester: Secondary | ICD-10-CM | POA: Diagnosis not present

## 2022-10-20 DIAGNOSIS — D563 Thalassemia minor: Secondary | ICD-10-CM

## 2022-10-20 DIAGNOSIS — B951 Streptococcus, group B, as the cause of diseases classified elsewhere: Secondary | ICD-10-CM

## 2022-10-20 LAB — COMPREHENSIVE METABOLIC PANEL
ALT: 17 IU/L (ref 0–32)
AST: 23 IU/L (ref 0–40)
Albumin/Globulin Ratio: 1.4 (ref 1.2–2.2)
Albumin: 3.6 g/dL — ABNORMAL LOW (ref 3.9–4.9)
Alkaline Phosphatase: 191 IU/L — ABNORMAL HIGH (ref 44–121)
BUN/Creatinine Ratio: 7 — ABNORMAL LOW (ref 9–23)
BUN: 6 mg/dL (ref 6–20)
Bilirubin Total: 0.2 mg/dL (ref 0.0–1.2)
CO2: 17 mmol/L — ABNORMAL LOW (ref 20–29)
Calcium: 9.3 mg/dL (ref 8.7–10.2)
Chloride: 106 mmol/L (ref 96–106)
Creatinine, Ser: 0.81 mg/dL (ref 0.57–1.00)
Globulin, Total: 2.6 g/dL (ref 1.5–4.5)
Glucose: 69 mg/dL — ABNORMAL LOW (ref 70–99)
Potassium: 4.5 mmol/L (ref 3.5–5.2)
Sodium: 139 mmol/L (ref 134–144)
Total Protein: 6.2 g/dL (ref 6.0–8.5)
eGFR: 99 mL/min/{1.73_m2} (ref >59)

## 2022-10-20 LAB — CERVICOVAGINAL ANCILLARY ONLY
Chlamydia: NEGATIVE
Comment: NEGATIVE
Comment: NORMAL
Neisseria Gonorrhea: NEGATIVE

## 2022-10-20 LAB — CBC
Hematocrit: 30.6 % — ABNORMAL LOW (ref 34.0–46.6)
Hemoglobin: 10 g/dL — ABNORMAL LOW (ref 11.1–15.9)
MCH: 29.3 pg (ref 26.6–33.0)
MCHC: 32.7 g/dL (ref 31.5–35.7)
MCV: 90 fL (ref 79–97)
Platelets: 206 10*3/uL (ref 150–450)
RBC: 3.41 x10E6/uL — ABNORMAL LOW (ref 3.77–5.28)
RDW: 14.5 % (ref 11.7–15.4)
WBC: 8.8 10*3/uL (ref 3.4–10.8)

## 2022-10-20 LAB — PROTEIN / CREATININE RATIO, URINE
Creatinine, Urine: 416.7 mg/dL
Protein, Ur: 1009.7 mg/dL
Protein/Creat Ratio: 2423 mg/g creat — ABNORMAL HIGH (ref 0–200)

## 2022-10-21 ENCOUNTER — Inpatient Hospital Stay (HOSPITAL_COMMUNITY)
Admission: AD | Admit: 2022-10-21 | Discharge: 2022-10-26 | DRG: 787 | Disposition: A | Discharge status: Home / Self Care | Payer: 59 | Attending: Obstetrics & Gynecology | Admitting: Obstetrics & Gynecology

## 2022-10-21 ENCOUNTER — Encounter (HOSPITAL_COMMUNITY): Payer: Self-pay | Admitting: Obstetrics and Gynecology

## 2022-10-21 ENCOUNTER — Encounter: Payer: Self-pay | Admitting: Obstetrics and Gynecology

## 2022-10-21 ENCOUNTER — Other Ambulatory Visit: Payer: Self-pay

## 2022-10-21 DIAGNOSIS — Z3A34 34 weeks gestation of pregnancy: Secondary | ICD-10-CM

## 2022-10-21 DIAGNOSIS — Z148 Genetic carrier of other disease: Secondary | ICD-10-CM

## 2022-10-21 DIAGNOSIS — O1413 Severe pre-eclampsia, third trimester: Principal | ICD-10-CM

## 2022-10-21 DIAGNOSIS — Z7982 Long term (current) use of aspirin: Secondary | ICD-10-CM | POA: Diagnosis not present

## 2022-10-21 DIAGNOSIS — O1414 Severe pre-eclampsia complicating childbirth: Principal | ICD-10-CM | POA: Diagnosis present

## 2022-10-21 DIAGNOSIS — N179 Acute kidney failure, unspecified: Secondary | ICD-10-CM | POA: Diagnosis present

## 2022-10-21 DIAGNOSIS — D62 Acute posthemorrhagic anemia: Secondary | ICD-10-CM | POA: Diagnosis not present

## 2022-10-21 DIAGNOSIS — O1494 Unspecified pre-eclampsia, complicating childbirth: Secondary | ICD-10-CM | POA: Diagnosis not present

## 2022-10-21 DIAGNOSIS — O99892 Other specified diseases and conditions complicating childbirth: Secondary | ICD-10-CM | POA: Diagnosis not present

## 2022-10-21 DIAGNOSIS — O9902 Anemia complicating childbirth: Secondary | ICD-10-CM | POA: Diagnosis not present

## 2022-10-21 DIAGNOSIS — O1490 Unspecified pre-eclampsia, unspecified trimester: Secondary | ICD-10-CM | POA: Diagnosis present

## 2022-10-21 DIAGNOSIS — Z98891 History of uterine scar from previous surgery: Secondary | ICD-10-CM

## 2022-10-21 DIAGNOSIS — O0993 Supervision of high risk pregnancy, unspecified, third trimester: Secondary | ICD-10-CM

## 2022-10-21 DIAGNOSIS — O99824 Streptococcus B carrier state complicating childbirth: Secondary | ICD-10-CM | POA: Diagnosis present

## 2022-10-21 DIAGNOSIS — D509 Iron deficiency anemia, unspecified: Secondary | ICD-10-CM | POA: Diagnosis not present

## 2022-10-21 DIAGNOSIS — D649 Anemia, unspecified: Secondary | ICD-10-CM | POA: Diagnosis not present

## 2022-10-21 DIAGNOSIS — O1493 Unspecified pre-eclampsia, third trimester: Secondary | ICD-10-CM

## 2022-10-21 LAB — COMPREHENSIVE METABOLIC PANEL
ALT: 15 U/L (ref 0–44)
AST: 26 U/L (ref 15–41)
Albumin: 2.8 g/dL — ABNORMAL LOW (ref 3.5–5.0)
Alkaline Phosphatase: 152 U/L — ABNORMAL HIGH (ref 38–126)
Anion gap: 10 (ref 5–15)
BUN: 8 mg/dL (ref 6–20)
CO2: 18 mmol/L — ABNORMAL LOW (ref 22–32)
Calcium: 8.6 mg/dL — ABNORMAL LOW (ref 8.9–10.3)
Chloride: 107 mmol/L (ref 98–111)
Creatinine, Ser: 0.86 mg/dL (ref 0.44–1.00)
GFR, Estimated: >60 mL/min (ref >60)
Glucose, Bld: 61 mg/dL — ABNORMAL LOW (ref 70–99)
Potassium: 4 mmol/L (ref 3.5–5.1)
Sodium: 135 mmol/L (ref 135–145)
Total Bilirubin: 0.3 mg/dL (ref 0.3–1.2)
Total Protein: 6 g/dL — ABNORMAL LOW (ref 6.5–8.1)

## 2022-10-21 LAB — URINALYSIS, ROUTINE W REFLEX MICROSCOPIC
Bilirubin Urine: NEGATIVE
Glucose, UA: NEGATIVE mg/dL
Ketones, ur: NEGATIVE mg/dL
Nitrite: NEGATIVE
Protein, ur: >300 mg/dL — AB
Specific Gravity, Urine: 1.015 (ref 1.005–1.030)
pH: 6.5 (ref 5.0–8.0)

## 2022-10-21 LAB — CBC
HCT: 32.5 % — ABNORMAL LOW (ref 36.0–46.0)
Hemoglobin: 10.1 g/dL — ABNORMAL LOW (ref 12.0–15.0)
MCH: 28.1 pg (ref 26.0–34.0)
MCHC: 31.1 g/dL (ref 30.0–36.0)
MCV: 90.5 fL (ref 80.0–100.0)
Platelets: 202 10*3/uL (ref 150–400)
RBC: 3.59 MIL/uL — ABNORMAL LOW (ref 3.87–5.11)
RDW: 15.2 % (ref 11.5–15.5)
WBC: 8.9 10*3/uL (ref 4.0–10.5)
nRBC: 0 % (ref 0.0–0.2)

## 2022-10-21 LAB — TYPE AND SCREEN
ABO/RH(D): A POS
Antibody Screen: NEGATIVE

## 2022-10-21 LAB — PROTEIN / CREATININE RATIO, URINE
Creatinine, Urine: 139 mg/dL
Protein Creatinine Ratio: 10.04 mg/mg{Cre} — ABNORMAL HIGH (ref 0.00–0.15)
Total Protein, Urine: 1396 mg/dL

## 2022-10-21 LAB — URINALYSIS, MICROSCOPIC (REFLEX)

## 2022-10-21 MED ORDER — OXYCODONE-ACETAMINOPHEN 5-325 MG PO TABS: 1.0000 | ORAL_TABLET | ORAL | Status: DC | PRN

## 2022-10-21 MED ORDER — MISOPROSTOL 25 MCG QUARTER TABLET
25.0000 ug | ORAL_TABLET | Freq: Once | ORAL | Status: AC
  Administered 2022-10-21: 25 ug via VAGINAL
  Filled 2022-10-21: qty 1

## 2022-10-21 MED ORDER — SOD CITRATE-CITRIC ACID 500-334 MG/5ML PO SOLN
30.0000 mL | ORAL | Status: DC | PRN
  Filled 2022-10-21: qty 30

## 2022-10-21 MED ORDER — LIDOCAINE HCL (PF) 1 % IJ SOLN: 30.0000 mL | INTRAMUSCULAR | Status: DC | PRN

## 2022-10-21 MED ORDER — LABETALOL HCL 5 MG/ML IV SOLN
20.0000 mg | INTRAVENOUS | Status: DC | PRN
  Administered 2022-10-21 – 2022-10-22 (×2): 20 mg via INTRAVENOUS
  Filled 2022-10-21 (×2): qty 4

## 2022-10-21 MED ORDER — MAGNESIUM SULFATE 40 GM/1000ML IV SOLN
INTRAVENOUS | Status: AC
  Administered 2022-10-21: 4 g via INTRAVENOUS
  Filled 2022-10-21: qty 1000

## 2022-10-21 MED ORDER — LABETALOL HCL 5 MG/ML IV SOLN
80.0000 mg | INTRAVENOUS | Status: DC | PRN
  Administered 2022-10-22: 80 mg via INTRAVENOUS
  Filled 2022-10-21: qty 16

## 2022-10-21 MED ORDER — MISOPROSTOL 50MCG HALF TABLET
50.0000 ug | ORAL_TABLET | Freq: Once | ORAL | Status: AC
  Administered 2022-10-21: 50 ug via ORAL
  Filled 2022-10-21: qty 1

## 2022-10-21 MED ORDER — LACTATED RINGERS IV SOLN: INTRAVENOUS | Status: DC

## 2022-10-21 MED ORDER — OXYTOCIN BOLUS FROM INFUSION: 333.0000 mL | Freq: Once | INTRAVENOUS | Status: DC

## 2022-10-21 MED ORDER — HYDRALAZINE HCL 20 MG/ML IJ SOLN: 10.0000 mg | INTRAMUSCULAR | Status: DC | PRN

## 2022-10-21 MED ORDER — ACETAMINOPHEN-CAFFEINE 500-65 MG PO TABS
2.0000 | ORAL_TABLET | Freq: Once | ORAL | Status: AC
  Administered 2022-10-21: 2 via ORAL
  Filled 2022-10-21: qty 2

## 2022-10-21 MED ORDER — ACETAMINOPHEN 325 MG PO TABS: 650.0000 mg | ORAL_TABLET | ORAL | Status: DC | PRN

## 2022-10-21 MED ORDER — OXYCODONE-ACETAMINOPHEN 5-325 MG PO TABS: 2.0000 | ORAL_TABLET | ORAL | Status: DC | PRN

## 2022-10-21 MED ORDER — OXYTOCIN-SODIUM CHLORIDE 30-0.9 UT/500ML-% IV SOLN
2.5000 [IU]/h | INTRAVENOUS | Status: DC
  Filled 2022-10-21: qty 500

## 2022-10-21 MED ORDER — MAGNESIUM SULFATE 40 GM/1000ML IV SOLN
2.0000 g/h | INTRAVENOUS | Status: DC
  Administered 2022-10-21 – 2022-10-23 (×2): 2 g/h via INTRAVENOUS
  Filled 2022-10-21 (×2): qty 1000

## 2022-10-21 MED ORDER — LABETALOL HCL 5 MG/ML IV SOLN
40.0000 mg | INTRAVENOUS | Status: DC | PRN
  Administered 2022-10-22: 40 mg via INTRAVENOUS
  Filled 2022-10-21: qty 8

## 2022-10-21 MED ORDER — MAGNESIUM SULFATE BOLUS VIA INFUSION
4.0000 g | Freq: Once | INTRAVENOUS | Status: AC
  Filled 2022-10-21: qty 1000

## 2022-10-21 MED ORDER — LACTATED RINGERS IV SOLN
500.0000 mL | INTRAVENOUS | Status: DC | PRN
  Administered 2022-10-23: 500 mL via INTRAVENOUS
  Administered 2022-10-23: 250 mL via INTRAVENOUS

## 2022-10-21 MED ORDER — ONDANSETRON HCL 4 MG/2ML IJ SOLN
4.0000 mg | Freq: Four times a day (QID) | INTRAMUSCULAR | Status: DC | PRN
  Administered 2022-10-23: 4 mg via INTRAVENOUS
  Filled 2022-10-21: qty 2

## 2022-10-21 MED ORDER — PENICILLIN G POT IN DEXTROSE 60000 UNIT/ML IV SOLN
3.0000 10*6.[IU] | INTRAVENOUS | Status: DC
  Administered 2022-10-22 – 2022-10-23 (×9): 3 10*6.[IU] via INTRAVENOUS
  Filled 2022-10-21 (×9): qty 50

## 2022-10-21 MED ORDER — SODIUM CHLORIDE 0.9 % IV SOLN
5.0000 10*6.[IU] | Freq: Once | INTRAVENOUS | Status: AC
  Administered 2022-10-21: 5 10*6.[IU] via INTRAVENOUS
  Filled 2022-10-21: qty 5

## 2022-10-21 NOTE — Progress Notes (Signed)
Vtx by [facility]

## 2022-10-21 NOTE — Progress Notes (Signed)
Nurse spoke with lab to confirm pt blood work and urine was collected from MAU. Lab orders released and labs will be ran.

## 2022-10-21 NOTE — H&P (Signed)
OBSTETRIC ADMISSION HISTORY AND PHYSICAL  Rachael [name] is a 32 y.o. female G14P0020 with IUP at 18w3dby early [place] 2/2 severe preE diagnosed in the MAU today. She reports +FMs, No LOF, no VB, no blurry vision or RUQ pain. Does endorse HA and increase weight gain.  She plans on breast feeding. She request nexplanon for birth control. [place]->  Estimated Date of Delivery: 11/29/22  Sono:    @[redacted]w[redacted]d$ , CWD, normal anatomy, breech presentation, posterior placental lie, 560g, 39% EFW   Prenatal History/Complications:  -PreE w/ SF -Anemia  -Alpha thal silent carrier  Past Medical History: Past Medical History:  Diagnosis Date   Difficult intubation 08/15/2014   Heart murmur     Past Surgical History: Past Surgical History:  Procedure Laterality Date   COSMETIC SURGERY     [place]      Obstetrical History: OB History     Gravida  3   Para  0   Term      Preterm      AB  2   Living         SAB  1   IAB  1   Ectopic      Multiple      Live Births              Social History Social History   Socioeconomic History   Marital status: Single    Spouse name: Not on file   Number of children: Not on file   Years of education: Not on file   Highest education level: Not on file  Occupational History   Not on file  Tobacco Use   Smoking status: Never   Smokeless tobacco: Never  Vaping Use   Vaping Use: Never used  Substance and Sexual Activity   Alcohol use: Not Currently   Drug use: Never   Sexual activity: Yes    Birth control/protection: None  Other Topics Concern   Not on file  Social History Narrative   Lives with friend. [place]   Financial Resource Strain: Low Risk  (05/03/2022)   Overall Financial Resource Strain (CARDIA)    Difficulty of Paying Living Expenses: Not hard  at all  Food Insecurity: No Food Insecurity (05/03/2022)   Hunger Vital Sign    Worried About Running Out of Food in the Last Year: Never true    Ran Out of Food in the Last Year: Never true  Transportation Needs: No Transportation Needs (05/03/2022)   PRAPARE - THydrologist(Medical): No    Lack of Transportation (Non-Medical): No  Physical Activity: Insufficiently Active (05/03/2022)   Exercise Vital Sign    Days of Exercise per Week: 3 days    Minutes of Exercise per Session: 20 min  Stress: No Stress Concern Present (05/03/2022)   [place]   Feeling of Stress : Only a little  Social Connections: Socially Isolated (05/03/2022)   Social Connection and Isolation Panel [NHANES]    Frequency of Communication with Friends and Family: Three times a week    Frequency of Social Gatherings with Friends and Family: Twice a week    Attends Religious Services: Never    AMarine scientistor Organizations: No  Attends Archivist Meetings: Never    Marital Status: Never married    Family History: Family History  Problem Relation Age of Onset   Asthma Mother    Hypertension Mother    Sleep apnea Mother    Heart disease Father    Heart attack Father        Sounds like arrythemia; died in 63s   Heart murmur Sister    Heart murmur Brother    Cancer Paternal Grandmother        breast cancer   Diabetes Neg Hx     Allergies: No Known Allergies  Medications Prior to Admission  Medication Sig Dispense Refill Last Dose   aspirin EC 81 MG tablet Take 1 tablet (81 mg total) by mouth daily. Swallow whole. 30 tablet 12 10/20/2022   famotidine (PEPCID) 20 MG tablet Take 1 tablet (20 mg total) by mouth 2 (two) times daily. 30 tablet 2 10/20/2022   Prenatal Vit-Fe Fumarate-FA (PRENATAL VITAMIN) 27-0.8 MG TABS Take 1 tablet by mouth daily in the afternoon. 30 tablet  10/20/2022   terconazole  (TERAZOL 7) 0.4 % vaginal cream Place 1 applicator vaginally at bedtime. 45 g 0 Past Week     Review of Systems   All systems reviewed and negative except as stated in HPI  Blood pressure 137/77, pulse 71, temperature 97.9 F (36.6 C), temperature source Oral, resp. rate 18, height 5' 3"$  (1.6 m), weight 84.8 kg, SpO2 100 %. General appearance: alert and no distress Lungs: normal effort Heart: regular rate noted Abdomen: gravid Pelvic: 0.5/thick/high Extremities: No LE edema Presentation: cephalic per BS[facility] in MAU Fetal monitoringBaseline: 125 bpm, Variability: Good {> 6 bpm), Accelerations: Reactive, and Decelerations: Absent Uterine activity irritability     Prenatal labs: ABO, Rh: --/--/A POS (02/17 1857) Antibody: NEG (02/17 1857) Rubella: 4.03 (08/30 1509) RPR: Non Reactive (12/28 [id number])  HBsAg: Negative (08/30 1509)  HIV: Non Reactive (12/28 0812)  GBS:   Unknown, PCN ppx 1 hr Glucola 124 Genetic screening  LR, female alpha thal silent carrier Anatomy US wnl  Prenatal Transfer Tool  Maternal Diabetes: No Genetic Screening: Normal Maternal Ultrasounds/Referrals: Normal Fetal Ultrasounds or other Referrals:  None Maternal Substance Abuse:  No Significant Maternal Medications:  None Significant Maternal Lab Results:  Other:  GBS unknown Number of Prenatal Visits:greater than 3 verified prenatal visits Other Comments:  None  Results for orders placed or performed during the hospital encounter of 10/21/22 (from the past 24 hour(s))  Urinalysis, Routine w reflex microscopic -Urine, Clean Catch   Collection Time: 10/21/22  6:29 PM  Result Value Ref Range   Color, Urine YELLOW YELLOW   APPearance CLEAR CLEAR   Specific Gravity, Urine 1.015 1.005 - 1.030   pH 6.5 5.0 - 8.0   Glucose, UA NEGATIVE NEGATIVE mg/dL   Hgb urine dipstick SMALL (A) NEGATIVE   Bilirubin Urine NEGATIVE NEGATIVE   Ketones, ur NEGATIVE NEGATIVE mg/dL   Protein, ur >300 (A) NEGATIVE mg/dL    Nitrite NEGATIVE NEGATIVE   Leukocytes,Ua SMALL (A) NEGATIVE  Urinalysis, Microscopic (reflex)   Collection Time: 10/21/22  6:29 PM  Result Value Ref Range   RBC / HPF 6-10 0 - 5 RBC/hpf   WBC, UA 21-50 0 - 5 WBC/hpf   Bacteria, UA FEW (A) NONE SEEN   Squamous Epithelial / HPF 21-50 0 - 5 /HPF   Amorphous Crystal PRESENT   Protein / creatinine ratio, urine   Collection Time: 10/21/22  6:48 PM  Result Value Ref Range   Creatinine, Urine 139 mg/dL   Total Protein, Urine 1,396 mg/dL   Protein Creatinine Ratio 10.04 (H) 0.00 - 0.15 mg/mg[Cre]  Type and screen [place]   Collection Time: 10/21/22  6:57 PM  Result Value Ref Range   ABO/RH(D) A POS    Antibody Screen NEG    Sample Expiration      10/24/2022,2359 Performed at [facility], [place] [address] [place], [place] [address]   Comprehensive metabolic panel   Collection Time: 10/21/22  6:58 PM  Result Value Ref Range   Sodium 135 135 - 145 mmol/L   Potassium 4.0 3.5 - 5.1 mmol/L   Chloride 107 98 - 111 mmol/L   CO2 18 (L) 22 - 32 mmol/L   Glucose, Bld 61 (L) 70 - 99 mg/dL   BUN 8 6 - 20 mg/dL   Creatinine, Ser 0.86 0.44 - 1.00 mg/dL   Calcium 8.6 (L) 8.9 - 10.3 mg/dL   Total Protein 6.0 (L) 6.5 - 8.1 g/dL   Albumin 2.8 (L) 3.5 - 5.0 g/dL   AST 26 15 - 41 U/L   ALT 15 0 - 44 U/L   Alkaline Phosphatase 152 (H) 38 - 126 U/L   Total Bilirubin 0.3 0.3 - 1.2 mg/dL   GFR, Estimated >60 >60 mL/min   Anion gap 10 5 - 15  CBC   Collection Time: 10/21/22  6:58 PM  Result Value Ref Range   WBC 8.9 4.0 - 10.5 K/uL   RBC 3.59 (L) 3.87 - 5.11 MIL/uL   Hemoglobin 10.1 (L) 12.0 - 15.0 g/dL   HCT 32.5 (L) 36.0 - 46.0 %   MCV 90.5 80.0 - 100.0 fL   MCH 28.1 26.0 - 34.0 pg   MCHC 31.1 30.0 - 36.0 g/dL   RDW 15.2 11.5 - 15.5 %   Platelets 202 150 - 400 K/uL   nRBC 0.0 0.0 - 0.2 %    Patient Active Problem List   Diagnosis Date Noted   Preeclampsia with severe features 10/21/2022   Pre-eclampsia  10/21/2022   Anemia affecting pregnancy in third trimester 09/01/2022   Alpha thalassemia silent carrier 05/15/2022   Supervision of normal pregnancy 05/03/2022    Assessment/Plan:  Rachael [name] is a 32 y.o. G3P0020 at 65w3dhere for IOL 2/2 preE w/ SF endorsing HA, severe range BP readings. Started on mag in MAU.   #Labor: Cyto 50 oral, 25 vaginal. Consider alternating cytotec v. FB at next cervical exam.  #Pain: Maternally supported, epidural planning #FWB: Cat I  #ID: GBS unknown, PCN ppx #MOF: Breast #MOC: Nexplanon #Circ:  N/a, girl  Rachael Chisom Autry-Lott, DO  10/21/2022, 11:05 PM

## 2022-10-21 NOTE — MAU Provider Note (Signed)
History     CSN: [id number]  Arrival date and time: 10/21/22 1752   None     Chief Complaint  Patient presents with   Hypertension   Headache   Foot Swelling   HPI Rachael [name] is a 32 y.o. G3P0020 at 20w3dwho presents to MAU elevated BP. Patient reports she has had intermittent headaches since 2/7. She reports she woke up this morning with a headache. She tried taking Tylenol which did not help. She reports she took her BP around 1500 and it was 171/92. She denies vision changes or RUQ/epigastric pain. She does report a 3lb weight gain over the pas 2 days.  She denies contractions, vaginal bleeding or leaking fluid. She reports active fetal movement although somewhat decreased today.   [facility]    OB History     Gravida  3   Para  0   Term      Preterm      AB  2   Living         SAB  1   IAB  1   Ectopic      Multiple      Live Births              Past Medical History:  Diagnosis Date   Difficult intubation 08/15/2014   Heart murmur     Past Surgical History:  Procedure Laterality Date   COSMETIC SURGERY     BBL [place]      Family History  Problem Relation Age of Onset   Asthma Mother    Hypertension Mother    Sleep apnea Mother    Heart disease Father    Heart attack Father        Sounds like arrythemia; died in 333s  Heart murmur Sister    Heart murmur Brother    Cancer Paternal Grandmother        breast cancer   Diabetes Neg Hx     Social History   Tobacco Use   Smoking status: Never   Smokeless tobacco: Never  Vaping Use   Vaping Use: Never used  Substance Use Topics   Alcohol use: Not Currently   Drug use: Never    Allergies: No Known Allergies  Medications Prior to Admission  Medication Sig Dispense Refill Last Dose   aspirin EC 81 MG tablet Take 1 tablet (81 mg total) by mouth daily. Swallow whole. 30 tablet 12 10/20/2022   famotidine (PEPCID) 20 MG  tablet Take 1 tablet (20 mg total) by mouth 2 (two) times daily. 30 tablet 2 10/20/2022   Prenatal Vit-Fe Fumarate-FA (PRENATAL VITAMIN) 27-0.8 MG TABS Take 1 tablet by mouth daily in the afternoon. 30 tablet  10/20/2022   terconazole (TERAZOL 7) 0.4 % vaginal cream Place 1 applicator vaginally at bedtime. 45 g 0 Past Week   Review of Systems  Constitutional: Negative.   Neurological:  Positive for headaches.  All other systems reviewed and are negative.   Physical Exam  Patient Vitals for the past 24 hrs:  BP Temp Temp src Pulse Resp SpO2 Height Weight  10/21/22 2000 (!) 159/77 -- -- 83 -- -- -- --  10/21/22 1954 -- -- -- -- -- 100 % -- --  10/21/22 1950 (!) 155/74 -- -- 69 18 -- -- --  10/21/22 1949 -- -- -- -- -- 100 % -- --  10/21/22 1947 -- -- -- -- --  100 % -- --  10/21/22 1940 (!) 165/81 -- -- 66 18 -- -- --  10/21/22 1930 (!) 151/87 -- -- 72 -- -- -- --  10/21/22 1916 (!) 140/81 -- -- 69 -- -- -- --  10/21/22 1900 (!) 181/84 -- -- 71 -- -- -- --  10/21/22 1845 (!) 177/88 -- -- 63 -- 100 % -- --  10/21/22 1814 (!) 159/93 98.3 F (36.8 C) Oral 69 15 100 % 5' 3"$  (1.6 m) 84.8 kg    Physical Exam Vitals and nursing note reviewed.  Constitutional:      General: She is not in acute distress. Eyes:     Extraocular Movements: Extraocular movements intact.     Pupils: Pupils are equal, round, and reactive to light.  Cardiovascular:     Rate and Rhythm: Normal rate.  Pulmonary:     Effort: Pulmonary effort is normal.  Abdominal:     Palpations: Abdomen is soft.     Tenderness: There is no abdominal tenderness.     Comments: Gravid    Musculoskeletal:     Cervical back: Normal range of motion.     Right lower leg: Edema present.     Left lower leg: Edema present.  Skin:    General: Skin is warm and dry.  Neurological:     General: No focal deficit present.     Mental Status: She is alert and oriented to person, place, and time.     Deep Tendon Reflexes:     Reflex  Scores:      Patellar reflexes are 3+ on the right side and 3+ on the left side.    Comments: Several beats of clonus noted in bilateral lower extremities   Psychiatric:        Mood and Affect: Mood normal.        Behavior: Behavior normal.    NST FHR: 130bpm, moderate variability, +15x15 accels, no decels Toco: irregular  Results for orders placed or performed during the hospital encounter of 10/21/22 (from the past 24 hour(s))  Urinalysis, Routine w reflex microscopic -Urine, Clean Catch     Status: Abnormal   Collection Time: 10/21/22  6:29 PM  Result Value Ref Range   Color, Urine YELLOW YELLOW   APPearance CLEAR CLEAR   Specific Gravity, Urine 1.015 1.005 - 1.030   pH 6.5 5.0 - 8.0   Glucose, UA NEGATIVE NEGATIVE mg/dL   Hgb urine dipstick SMALL (A) NEGATIVE   Bilirubin Urine NEGATIVE NEGATIVE   Ketones, ur NEGATIVE NEGATIVE mg/dL   Protein, ur >300 (A) NEGATIVE mg/dL   Nitrite NEGATIVE NEGATIVE   Leukocytes,Ua SMALL (A) NEGATIVE  Urinalysis, Microscopic (reflex)     Status: Abnormal   Collection Time: 10/21/22  6:29 PM  Result Value Ref Range   RBC / HPF 6-10 0 - 5 RBC/hpf   WBC, UA 21-50 0 - 5 WBC/hpf   Bacteria, UA FEW (A) NONE SEEN   Squamous Epithelial / HPF 21-50 0 - 5 /HPF   Amorphous Crystal PRESENT   Type and screen [place]     Status: None (Preliminary result)   Collection Time: 10/21/22  6:57 PM  Result Value Ref Range   ABO/RH(D) PENDING    Antibody Screen PENDING    Sample Expiration      10/24/2022,2359 Performed at [facility], [address] [place], [place] [address]   Comprehensive metabolic panel     Status: Abnormal   Collection Time:  10/21/22  6:58 PM  Result Value Ref Range   Sodium 135 135 - 145 mmol/L   Potassium 4.0 3.5 - 5.1 mmol/L   Chloride 107 98 - 111 mmol/L   CO2 18 (L) 22 - 32 mmol/L   Glucose, Bld 61 (L) 70 - 99 mg/dL   BUN 8 6 - 20 mg/dL   Creatinine, Ser 0.86 0.44 - 1.00 mg/dL   Calcium 8.6 (L)  8.9 - 10.3 mg/dL   Total Protein 6.0 (L) 6.5 - 8.1 g/dL   Albumin 2.8 (L) 3.5 - 5.0 g/dL   AST 26 15 - 41 U/L   ALT 15 0 - 44 U/L   Alkaline Phosphatase 152 (H) 38 - 126 U/L   Total Bilirubin 0.3 0.3 - 1.2 mg/dL   GFR, Estimated >60 >60 mL/min   Anion gap 10 5 - 15  CBC     Status: Abnormal   Collection Time: 10/21/22  6:58 PM  Result Value Ref Range   WBC 8.9 4.0 - 10.5 K/uL   RBC 3.59 (L) 3.87 - 5.11 MIL/uL   Hemoglobin 10.1 (L) 12.0 - 15.0 g/dL   HCT 32.5 (L) 36.0 - 46.0 %   MCV 90.5 80.0 - 100.0 fL   MCH 28.1 26.0 - 34.0 pg   MCHC 31.1 30.0 - 36.0 g/dL   RDW 15.2 11.5 - 15.5 %   Platelets 202 150 - 400 K/uL   nRBC 0.0 0.0 - 0.2 %    MAU Course  Procedures  MDM CBC, CMP, UPCR IV, labetalol protocol Excedrin NST  On arrival, patient had multiple severe range BPs. Labetalol protocol initiated. Labs ordered. Patient given Excedrin for headache. Dr. Ilda Basset notified of patient and findings. Will admit to L&D for IOL in the setting of pre-eclampsia with severe features (BP, headache). Will start mag sulfate. Hold BMZ per Dr. Ilda Basset. PCN ordered. L&D team notified.   Assessment and Plan   1. Pre-eclampsia, severe, antepartum, third trimester   2. [redacted] weeks gestation of pregnancy    - Admit to L&D for IOL in the setting of pre-e with SF. Mag sulfate ordered - L&D team to assume care of patient   LYNISHA CRAZE, CNM 10/21/2022, 8:08 PM

## 2022-10-21 NOTE — Telephone Encounter (Signed)
Patient called me back around 1:40pm. Reported that she had just gotten her lab work so hadn't gotten the official diagnosis of preeclampsia. Reports having fetal monitoring done yesterday without complication and reports excellent fetal movement. Has a headache but just woke up from a nap and is going to take tylenol.   Reviewed that she will need monitoring for the remainder of her pregnancy including weekly BPPs, weekly labs and delivery by 37 weeks.   Discussed that she should present to the MAU today for the following: SBP > 160 or DBP > 110 Persistent headache Chest pain, shortness of breath, visual changes, or RUQ/epigastric pain Decreased fetal movement  She expressed her understanding. She will follow up with Dr. Sabra Heck as scheduled on 2/23.   Gale Journey, MD [place], [facility] for [employer], [place]

## 2022-10-21 NOTE — Progress Notes (Signed)
Per Pharmacist magnesium & pen G okay to infuse together.

## 2022-10-21 NOTE — MAU Note (Addendum)
...  Rachael [name] is a 32 y.o. at 81w3dhere in MAU reporting: Was instructed to come to MAU for an elevated pressure of 153/94. She reports around 1500 today her BP was 171/92. Endorses a current HA. She reports her HA's started 2/7. Denies RUQ/epigastric pain and visual disturbances. Denies VB or LOF. +FM.   3 lb weight gain since 2/15. Was 183 lb. Now 186.9 lb.  Onset of complaint: Today Pain score: 4/10 HA - anterior  Lab orders placed from triage: UA FHT: 135 doppler

## 2022-10-22 LAB — CBC WITH DIFFERENTIAL/PLATELET
Abs Immature Granulocytes: 0.03 10*3/uL (ref 0.00–0.07)
Basophils Absolute: 0.1 10*3/uL (ref 0.0–0.1)
Basophils Relative: 1 %
Eosinophils Absolute: 0 10*3/uL (ref 0.0–0.5)
Eosinophils Relative: 0 %
HCT: 32.2 % — ABNORMAL LOW (ref 36.0–46.0)
Hemoglobin: 10.8 g/dL — ABNORMAL LOW (ref 12.0–15.0)
Immature Granulocytes: 0 %
Lymphocytes Relative: 27 %
Lymphs Abs: 2.7 10*3/uL (ref 0.7–4.0)
MCH: 28.7 pg (ref 26.0–34.0)
MCHC: 33.5 g/dL (ref 30.0–36.0)
MCV: 85.6 fL (ref 80.0–100.0)
Monocytes Absolute: 0.7 10*3/uL (ref 0.1–1.0)
Monocytes Relative: 6 %
Neutro Abs: 6.8 10*3/uL (ref 1.7–7.7)
Neutrophils Relative %: 66 %
Platelets: 215 10*3/uL (ref 150–400)
RBC: 3.76 MIL/uL — ABNORMAL LOW (ref 3.87–5.11)
RDW: 15.3 % (ref 11.5–15.5)
WBC: 10.3 10*3/uL (ref 4.0–10.5)
nRBC: 0 % (ref 0.0–0.2)

## 2022-10-22 LAB — CBC
HCT: 30.1 % — ABNORMAL LOW (ref 36.0–46.0)
Hemoglobin: 10.1 g/dL — ABNORMAL LOW (ref 12.0–15.0)
MCH: 28.9 pg (ref 26.0–34.0)
MCHC: 33.6 g/dL (ref 30.0–36.0)
MCV: 86 fL (ref 80.0–100.0)
Platelets: 212 10*3/uL (ref 150–400)
RBC: 3.5 MIL/uL — ABNORMAL LOW (ref 3.87–5.11)
RDW: 15.2 % (ref 11.5–15.5)
WBC: 11 10*3/uL — ABNORMAL HIGH (ref 4.0–10.5)
nRBC: 0 % (ref 0.0–0.2)

## 2022-10-22 LAB — COMPREHENSIVE METABOLIC PANEL
ALT: 16 U/L (ref 0–44)
AST: 28 U/L (ref 15–41)
Albumin: 2.5 g/dL — ABNORMAL LOW (ref 3.5–5.0)
Alkaline Phosphatase: 181 U/L — ABNORMAL HIGH (ref 38–126)
Anion gap: 10 (ref 5–15)
BUN: 5 mg/dL — ABNORMAL LOW (ref 6–20)
CO2: 18 mmol/L — ABNORMAL LOW (ref 22–32)
Calcium: 7.8 mg/dL — ABNORMAL LOW (ref 8.9–10.3)
Chloride: 104 mmol/L (ref 98–111)
Creatinine, Ser: 0.97 mg/dL (ref 0.44–1.00)
GFR, Estimated: >60 mL/min (ref >60)
Glucose, Bld: 93 mg/dL (ref 70–99)
Potassium: 3.8 mmol/L (ref 3.5–5.1)
Sodium: 132 mmol/L — ABNORMAL LOW (ref 135–145)
Total Bilirubin: 0.6 mg/dL (ref 0.3–1.2)
Total Protein: 5.8 g/dL — ABNORMAL LOW (ref 6.5–8.1)

## 2022-10-22 LAB — CULTURE, BETA STREP (GROUP B ONLY): Strep Gp B Culture: POSITIVE — AB

## 2022-10-22 LAB — MAGNESIUM: Magnesium: 5.6 mg/dL — ABNORMAL HIGH (ref 1.7–2.4)

## 2022-10-22 LAB — RPR: RPR Ser Ql: NONREACTIVE

## 2022-10-22 MED ORDER — MISOPROSTOL 25 MCG QUARTER TABLET
25.0000 ug | ORAL_TABLET | Freq: Once | ORAL | Status: DC
  Filled 2022-10-22: qty 1

## 2022-10-22 MED ORDER — MISOPROSTOL 25 MCG QUARTER TABLET: 25.0000 ug | ORAL_TABLET | Freq: Once | ORAL | Status: DC

## 2022-10-22 MED ORDER — MISOPROSTOL 25 MCG QUARTER TABLET
25.0000 ug | ORAL_TABLET | Freq: Once | ORAL | Status: AC
  Administered 2022-10-22: 25 ug via VAGINAL
  Filled 2022-10-22: qty 1

## 2022-10-22 MED ORDER — MISOPROSTOL 50MCG HALF TABLET
50.0000 ug | ORAL_TABLET | ORAL | Status: DC
  Administered 2022-10-22 (×3): 50 ug via BUCCAL
  Filled 2022-10-22 (×3): qty 1

## 2022-10-22 MED ORDER — FENTANYL CITRATE (PF) 100 MCG/2ML IJ SOLN
50.0000 ug | INTRAMUSCULAR | Status: DC | PRN
  Administered 2022-10-22 (×4): 100 ug via INTRAVENOUS
  Filled 2022-10-22 (×4): qty 2

## 2022-10-22 MED ORDER — DIPHENHYDRAMINE HCL 50 MG/ML IJ SOLN
25.0000 mg | Freq: Once | INTRAMUSCULAR | Status: AC
  Administered 2022-10-22: 25 mg via INTRAVENOUS
  Filled 2022-10-22: qty 1

## 2022-10-22 MED ORDER — LABETALOL HCL 100 MG PO TABS: 100.0000 mg | ORAL_TABLET | Freq: Two times a day (BID) | ORAL | Status: DC

## 2022-10-22 MED ORDER — LABETALOL HCL 100 MG PO TABS
100.0000 mg | ORAL_TABLET | Freq: Two times a day (BID) | ORAL | Status: DC
  Administered 2022-10-22 (×2): 100 mg via ORAL
  Filled 2022-10-22 (×2): qty 1

## 2022-10-22 NOTE — Progress Notes (Signed)
Rachael [name] is a 32 y.o. G3P0020 at [redacted]w[redacted]d  Subjective: Mild cramping.   Objective: BP (!) 141/69   Pulse 81   Temp 98.3 F (36.8 C) (Oral)   Resp 20   Ht 5' 3"$  (1.6 m)   Wt 84.8 kg   SpO2 99%   BMI 33.11 kg/m    FHT:  FHR: 115 bpm, variability: min,  accelerations:  10x10,  decelerations:  none UC:   Q 1-5 minutes, mild Dilation: 1 (uncertain of exact exam due to pt intolerance of exam due to significant vulvar edema.  Effacement (%): Thick Cervical Position: Posterior Station: Ballotable Presentation: Vertex verified by BS [place]  Exam by:: STamala JulianCNM  Labs: NA  Assessment / Plan: 3109w4deek IUP Labor: Early, slow progress on Cytotec. Unable to reach cervix to place Foley. Plan IV pain meds prio to next exam to aid pt tolerance and hopefully place Foley if still indicated. Continue Cytotec for now.  Vulvar Edema: Ice, Benadryl, encourage side-lying position to try to decrease edema.  Fetal Wellbeing:  Category I- II, overall reassuring and appropriate for pt being on Mag Sulfate.  Pain Control:  Comfort measures, Fentanyl Anticipated MOD:  SVD  SmTamala JulianiVermontCNOak[place]/18/2024 1:31 PM

## 2022-10-22 NOTE — Progress Notes (Signed)
Labor Progress Note Rachael [name] is a 32 y.o. G3P0020 at 32w4dpresented for IOL 2/2 severe preE  S: No acute concerns.   O:  BP (!) 156/76   Pulse 74   Temp 97.9 F (36.6 C) (Oral)   Resp 16   Ht 5' 3"$  (1.6 m)   Wt 84.8 kg   SpO2 99%   BMI 33.11 kg/m  EFM: 120bpm/minimal/no accels, no decels  CVE: Dilation: 1 Effacement (%): Thick Cervical Position: Posterior Station: Ballotable Presentation: Vertex Exam by:: SAlden Hipp RN   A&P: 32y.o. GBC:[id number] for IOL 2/2 severe pre E #Labor: Consider need for additional Cytotec/FB at next cervical exam.  #Pain: Planning for epidural #FWB: Cat II, mag induced but has periods of accels and Cat I  #GBS positive, PCN ppx  Severe preE  -Mg++, f/u mag lvl -BP requiring IV labetalol, start labetalol 1007mID -CTM  Vennie Waymire Autry-Lott, DO 6:43 AM

## 2022-10-22 NOTE — Progress Notes (Signed)
Rachael [name] is a 32 y.o. G3P0020 at [redacted]w[redacted]d  Subjective: Mild cramping  Objective: BP (!) 143/88   Pulse 79   Temp 98.4 F (36.9 C) (Oral)   Resp 18   Ht 5' 3"$  (1.6 m)   Wt 84.8 kg   SpO2 98%   BMI 33.11 kg/m    FHT:  FHR: 120 bpm, variability: mod,  accelerations:  15x15,  decelerations:  none UC:   Q 2-8 minutes, mild Dilation: 1 Effacement (%): Thick Cervical Position: Posterior Station: Ballotable Presentation: Vertex Exam by:: Samrat Hayward cnm Significant vulvar edema   Labs: Results for orders placed or performed during the hospital encounter of 10/21/22 (from the past 24 hour(s))  Urinalysis, Routine w reflex microscopic -Urine, Clean Catch     Status: Abnormal   Collection Time: 10/21/22  6:29 PM  Result Value Ref Range   Color, Urine YELLOW YELLOW   APPearance CLEAR CLEAR   Specific Gravity, Urine 1.015 1.005 - 1.030   pH 6.5 5.0 - 8.0   Glucose, UA NEGATIVE NEGATIVE mg/dL   Hgb urine dipstick SMALL (A) NEGATIVE   Bilirubin Urine NEGATIVE NEGATIVE   Ketones, ur NEGATIVE NEGATIVE mg/dL   Protein, ur >300 (A) NEGATIVE mg/dL   Nitrite NEGATIVE NEGATIVE   Leukocytes,Ua SMALL (A) NEGATIVE  Urinalysis, Microscopic (reflex)     Status: Abnormal   Collection Time: 10/21/22  6:29 PM  Result Value Ref Range   RBC / HPF 6-10 0 - 5 RBC/hpf   WBC, UA 21-50 0 - 5 WBC/hpf   Bacteria, UA FEW (A) NONE SEEN   Squamous Epithelial / HPF 21-50 0 - 5 /HPF   Amorphous Crystal PRESENT   Protein / creatinine ratio, urine     Status: Abnormal   Collection Time: 10/21/22  6:48 PM  Result Value Ref Range   Creatinine, Urine 139 mg/dL   Total Protein, Urine 1,396 mg/dL   Protein Creatinine Ratio 10.04 (H) 0.00 - 0.15 mg/mg[Cre]  Type and screen [place]    Status: None   Collection Time: 10/21/22  6:57 PM  Result Value Ref Range   ABO/RH(D) A POS    Antibody Screen NEG    Sample Expiration      10/24/2022,2359 [facility]formed at [facility], [address], [place] [address]  Comprehensive metabolic panel     Status: Abnormal   Collection Time: 10/21/22  6:58 PM  Result Value Ref Range   Sodium 135 135 - 145 mmol/L   Potassium 4.0 3.5 - 5.1 mmol/L   Chloride 107 98 - 111 mmol/L   CO2 18 (L) 22 - 32 mmol/L   Glucose, Bld 61 (L) 70 - 99 mg/dL   BUN 8 6 - 20 mg/dL   Creatinine, Ser 0.86 0.44 - 1.00 mg/dL   Calcium 8.6 (L) 8.9 - 10.3 mg/dL   Total Protein 6.0 (L) 6.5 - 8.1 g/dL   Albumin 2.8 (L) 3.5 - 5.0 g/dL   AST 26 15 - 41 U/L   ALT 15 0 - 44 U/L   Alkaline Phosphatase 152 (H) 38 - 126 U/L   Total Bilirubin 0.3 0.3 - 1.2 mg/dL   GFR, Estimated >60 >60 mL/min   Anion gap 10 5 - 15  CBC     Status: Abnormal   Collection Time: 10/21/22  6:58 PM  Result Value Ref Range   WBC 8.9 4.0 - 10.5 K/uL   RBC 3.59 (L) 3.87 - 5.11 MIL/uL  Hemoglobin 10.1 (L) 12.0 - 15.0 g/dL   HCT 32.5 (L) 36.0 - 46.0 %   MCV 90.5 80.0 - 100.0 fL   MCH 28.1 26.0 - 34.0 pg   MCHC 31.1 30.0 - 36.0 g/dL   RDW 15.2 11.5 - 15.5 %   Platelets 202 150 - 400 K/uL   nRBC 0.0 0.0 - 0.2 %  RPR     Status: None   Collection Time: 10/21/22  6:58 PM  Result Value Ref Range   RPR Ser Ql NON REACTIVE NON REACTIVE  CBC     Status: Abnormal   Collection Time: 10/22/22  5:57 AM  Result Value Ref Range   WBC 11.0 (H) 4.0 - 10.5 K/uL   RBC 3.50 (L) 3.87 - 5.11 MIL/uL   Hemoglobin 10.1 (L) 12.0 - 15.0 g/dL   HCT 30.1 (L) 36.0 - 46.0 %   MCV 86.0 80.0 - 100.0 fL   MCH 28.9 26.0 - 34.0 pg   MCHC 33.6 30.0 - 36.0 g/dL   RDW 15.2 11.5 - 15.5 %   Platelets 212 150 - 400 K/uL   nRBC 0.0 0.0 - 0.2 %  Magnesium     Status: Abnormal   Collection Time: 10/22/22  5:57 AM  Result Value Ref Range   Magnesium 5.6 (H) 1.7 - 2.4 mg/dL    Assessment / Plan: 65w4dweek IUP Labor: Early/IOL. Unable to reach cervix for Foley and pt intolerant of exam. Switch to buccal Cytotec 50 mcg.   Fetal Wellbeing:  Category I Pain Control:  Comfort measures Anticipated MOD:   SVD  STamala JulianVVermont CAssumption2/18/2024 10:41 AM

## 2022-10-22 NOTE — Progress Notes (Signed)
Labor Progress Note Rachael [name] is a 32 y.o. G3P0020 at 81w4dpresented for IOL 2/2 severe preE  S: No acute concerns.   O:  BP 135/66 (BP Location: Right Arm)   Pulse 79   Temp 98.7 F (37.1 C) (Axillary)   Resp 20   Ht 5' 3"$  (1.6 m)   Wt 84.8 kg   SpO2 92%   BMI 33.11 kg/m  EFM: 120bpm/minimal/no accels, no decels  CVE: Dilation: 1.5 Effacement (%): Thick Cervical Position: Posterior Station: Ballotable Presentation: Vertex Exam by:: Rachael JulianCNM   A&P: 32y.o. GMP:[id number] for IOL 2/2 severe pre E #Labor: FB placed, oral cytotec 50 mg. Start pitocin when FB out.  #Pain: Planning for epidural #FWB: Cat II, mag induced but has periods of reactivity #GBS positive, PCN ppx  Severe preE  -Mg++ -Continue labetalol 10038mID -CTM  Rachael Dafoe Autry-Lott, DO 10:35 PM

## 2022-10-22 NOTE — Plan of Care (Signed)
  Problem: Education: Goal: Knowledge of disease or condition will improve Outcome: Progressing Goal: Knowledge of the prescribed therapeutic regimen will improve Outcome: Progressing   Problem: Fluid Volume: Goal: Peripheral tissue perfusion will improve Outcome: Progressing   Problem: Clinical Measurements: Goal: Complications related to disease process, condition or treatment will be avoided or minimized Outcome: Progressing   Problem: Education: Goal: Knowledge of Childbirth will improve Outcome: Progressing Goal: Ability to make informed decisions regarding treatment and plan of care will improve Outcome: Progressing Goal: Ability to state and carry out methods to decrease the pain will improve Outcome: Progressing Goal: Individualized Educational Video(s) Outcome: Progressing   Problem: Coping: Goal: Ability to verbalize concerns and feelings about labor and delivery will improve Outcome: Progressing   Problem: Life Cycle: Goal: Ability to make normal progression through stages of labor will improve Outcome: Progressing Goal: Ability to effectively push during vaginal delivery will improve Outcome: Progressing   Problem: Role Relationship: Goal: Will demonstrate positive interactions with the child Outcome: Progressing   Problem: Safety: Goal: Risk of complications during labor and delivery will decrease Outcome: Progressing   Problem: Pain Management: Goal: Relief or control of pain from uterine contractions will improve Outcome: Progressing   Problem: Education: Goal: Knowledge of General Education information will improve Description: Including pain rating scale, medication(s)/side effects and non-pharmacologic comfort measures Outcome: Progressing   Problem: Health Behavior/Discharge Planning: Goal: Ability to manage health-related needs will improve Outcome: Progressing   Problem: Clinical Measurements: Goal: Ability to maintain clinical measurements  within normal limits will improve Outcome: Progressing Goal: Will remain free from infection Outcome: Progressing Goal: Diagnostic test results will improve Outcome: Progressing Goal: Respiratory complications will improve Outcome: Progressing Goal: Cardiovascular complication will be avoided Outcome: Progressing   Problem: Activity: Goal: Risk for activity intolerance will decrease Outcome: Progressing   Problem: Nutrition: Goal: Adequate nutrition will be maintained Outcome: Progressing   Problem: Coping: Goal: Level of anxiety will decrease Outcome: Progressing   Problem: Elimination: Goal: Will not experience complications related to bowel motility Outcome: Progressing Goal: Will not experience complications related to urinary retention Outcome: Progressing   Problem: Pain Managment: Goal: General experience of comfort will improve Outcome: Progressing   Problem: Safety: Goal: Ability to remain free from injury will improve Outcome: Progressing   Problem: Skin Integrity: Goal: Risk for impaired skin integrity will decrease Outcome: Progressing   

## 2022-10-22 NOTE — Consult Note (Addendum)
.  Consultation Service: Neonatology   Dr. Janus Molder has asked for consultation on Rachael [name] regarding the care of a premature infant at 34+3wk. Thank you for inviting [place] to see this patient.   Reason for consult:  Explain the possible complications, the prognosis, and the care of a premature infant at 34 wk and 3/7 weeks.  Chief complaint: Rachael [name] 32 y.o. [phone] with a single IUP at 34+3 wga by 8 week sono, [facility] 11/29/22.  Pregnancy has been relatively uncomplicated until PreE with severe features diagnosed 10/21/22, prompting IOL.  Prenatal labs reviewed, MBT A+ ab neg, RPR NR, HBsAg neg, HIV NR, nl glucola, GBS unknown - pending.   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 Depending on the carrier status of the individual's partner, this couple may be at increased risk to have a child with Hemoglobin H Disease. Carrier screening for this individual's partner is suggested.   Most recent sono @23$ + weeks, CWD, normal anatomy, breech presentation, posterior placental lie, 560g, 39% EFW  Prenatal care:   good Maternal antibiotics:  PCN for GBS unknown, prematurity.  First dose 2152 on 10/21/22.   Maternal Steroids: None, BMZ not indicated Maternal Meds:  Labatelol continuous magnesium infusion 2g/hr Pepcid    My recommendations for this patient and my actions included:   1. In the presence of the Rachael [name] and her partner and FOB Rachael [name] , I spent 20 minutes discussing the possible complications and outcomes of prematurity at this gestational age. I discussed specific complications at this gestational age referencing the need for resuscitation at birth, varying degrees of respiratory distress but mostly minimal at this GA, IV fluids pending establishment of enteral feeds (encouraged breast milk feeding),  temperature support, and monitoring. I also discussed the potential risk of complications including need for respiratory support, apnea, infection / sepsis, hypoglycemia, transient alterations in bone marrow function related to PreEclampsia,  hyperbilirubinemia requiring phototherapy, and difficulty establishing oral feedings.  I discussed potential length of stay 2-6 weeks, with a reasonable date of discharge to be at or around the expected delivery date.  I discussed this with parents in detail and they expressed an understanding of the risks and complications of prematurity.     2. I specifically discussed the expected survival of an infant born at 71-[redacted] wks gestation which is excellent We further discussed that very few of the neonates born at this age have profound or severe neurological complications and school difficulties. Overall I reviewed that their child's chance of survival nears that of a baby born at term gestation, with only slightly higher risks of morbidities discussed above.   3. I informed her that the NICU team would be present at the delivery.   4. A visit to the NICU by the infant's mother and/or a significant other was encouraged. Visitation policy was discussed. Of note, Rachael [name], the MOB is a Occupational psychologist at [employer].   5. Recommend FOB testing for alpha thal mutations, which could inform their risk for future children with Hemoglobin H disease.  Follow this baby's NBS w/ hgb results.     Final Impression:  32 y.o. female with a single viable IUP who is undergoing induction of labor for PreE with severe feautures at a premature gestational age of 106+3 wks. She now understands the possible complications and prognosis of her infant.  Rachael [name] questions were answered. She is planning to try and provide breast milk for her infant.     ______________________________________________________________________  Thank you for asking [place] to participate in the  care of this patient. Please do not hesitate to contact us again if you are aware of any further ways we can be of assistance.   Sincerely,  Denna Haggard, MD Attending Neonatologist   I spent ~40 minutes in consultation time, of which 20 minutes was spent in direct face to face counseling.

## 2022-10-22 NOTE — Progress Notes (Signed)
Ethelreda Klitzke is a 32 y.o. G3P0020 at [redacted]w[redacted]d  Subjective: Coping well with UC's   Objective: BP (!) 111/95 (BP Location: Right Arm)   Pulse 80   Temp 98.8 F (37.1 C) (Axillary)   Resp (!) 22 Comment: labor pain  Ht 5' 3"$  (1.6 m)   Wt 84.8 kg   SpO2 99%   BMI 33.11 kg/m    FHT:  FHR: 115 bpm, variability: min  accelerations:  10x10,  decelerations:  none UC:   Q 2-5 minutes, mild-mod Dilation: 1.5 Effacement (%): Thick Cervical Position: Posterior Station: Ballotable Presentation: Vertex Exam by:: STamala JulianCNM Attempted foley bulb placement. Vulvar and vaginal edema obscured view and made exam difficult. Placement unsuccessful.  Labs: NA  Assessment / Plan: 385w4deek IUP Labor: Early/IOL. Progressing slowly. Will change to vaginal Cytotec.  Fetal Wellbeing:  Category II, overall reassuring and as expected for pt on Mag Sulfate Pain Control:  Fentanyl Anticipated MOD:  SVD Pre-E: On Mag Sulfate. No severe range BP's on PO Labetalol. Will draw new Pre-E labs.   SmTamala JulianViVermontCNNorth [place]/18/2024 5:52 PM

## 2022-10-23 ENCOUNTER — Other Ambulatory Visit: Payer: Self-pay

## 2022-10-23 ENCOUNTER — Inpatient Hospital Stay (HOSPITAL_COMMUNITY): Payer: 59 | Admitting: Anesthesiology

## 2022-10-23 ENCOUNTER — Encounter (HOSPITAL_BASED_OUTPATIENT_CLINIC_OR_DEPARTMENT_OTHER): Payer: Self-pay | Admitting: Obstetrics & Gynecology

## 2022-10-23 ENCOUNTER — Encounter (HOSPITAL_COMMUNITY)
Admission: AD | Disposition: A | Discharge status: Home / Self Care | Payer: Self-pay | Source: Home / Self Care | Attending: Obstetrics and Gynecology

## 2022-10-23 ENCOUNTER — Encounter (HOSPITAL_COMMUNITY): Payer: Self-pay | Admitting: Obstetrics and Gynecology

## 2022-10-23 ENCOUNTER — Encounter: Payer: Self-pay | Admitting: *Deleted

## 2022-10-23 DIAGNOSIS — D649 Anemia, unspecified: Secondary | ICD-10-CM

## 2022-10-23 DIAGNOSIS — O1414 Severe pre-eclampsia complicating childbirth: Secondary | ICD-10-CM

## 2022-10-23 DIAGNOSIS — Z98891 History of uterine scar from previous surgery: Secondary | ICD-10-CM

## 2022-10-23 DIAGNOSIS — O9902 Anemia complicating childbirth: Secondary | ICD-10-CM

## 2022-10-23 DIAGNOSIS — Z3A34 34 weeks gestation of pregnancy: Secondary | ICD-10-CM

## 2022-10-23 DIAGNOSIS — B951 Streptococcus, group B, as the cause of diseases classified elsewhere: Secondary | ICD-10-CM | POA: Insufficient documentation

## 2022-10-23 DIAGNOSIS — O1494 Unspecified pre-eclampsia, complicating childbirth: Secondary | ICD-10-CM

## 2022-10-23 LAB — CBC
HCT: 32.1 % — ABNORMAL LOW (ref 36.0–46.0)
HCT: 33.3 % — ABNORMAL LOW (ref 36.0–46.0)
HCT: 32 % — ABNORMAL LOW (ref 36.0–46.0)
Hemoglobin: 10.6 g/dL — ABNORMAL LOW (ref 12.0–15.0)
Hemoglobin: 11 g/dL — ABNORMAL LOW (ref 12.0–15.0)
Hemoglobin: 10.8 g/dL — ABNORMAL LOW (ref 12.0–15.0)
MCH: 28.6 pg (ref 26.0–34.0)
MCH: 28.5 pg (ref 26.0–34.0)
MCH: 28.9 pg (ref 26.0–34.0)
MCHC: 33 g/dL (ref 30.0–36.0)
MCHC: 33 g/dL (ref 30.0–36.0)
MCHC: 33.8 g/dL (ref 30.0–36.0)
MCV: 86.5 fL (ref 80.0–100.0)
MCV: 86.3 fL (ref 80.0–100.0)
MCV: 85.6 fL (ref 80.0–100.0)
Platelets: 215 10*3/uL (ref 150–400)
Platelets: 211 10*3/uL (ref 150–400)
Platelets: 226 10*3/uL (ref 150–400)
RBC: 3.71 MIL/uL — ABNORMAL LOW (ref 3.87–5.11)
RBC: 3.86 MIL/uL — ABNORMAL LOW (ref 3.87–5.11)
RBC: 3.74 MIL/uL — ABNORMAL LOW (ref 3.87–5.11)
RDW: 15.5 % (ref 11.5–15.5)
RDW: 15.5 % (ref 11.5–15.5)
RDW: 15.3 % (ref 11.5–15.5)
WBC: 12.7 10*3/uL — ABNORMAL HIGH (ref 4.0–10.5)
WBC: 15.8 10*3/uL — ABNORMAL HIGH (ref 4.0–10.5)
WBC: 17 10*3/uL — ABNORMAL HIGH (ref 4.0–10.5)
nRBC: 0 % (ref 0.0–0.2)
nRBC: 0 % (ref 0.0–0.2)
nRBC: 0 % (ref 0.0–0.2)

## 2022-10-23 LAB — COMPREHENSIVE METABOLIC PANEL
ALT: 16 U/L (ref 0–44)
ALT: 16 U/L (ref 0–44)
ALT: 14 U/L (ref 0–44)
AST: 24 U/L (ref 15–41)
AST: 28 U/L (ref 15–41)
AST: 29 U/L (ref 15–41)
Albumin: 2.4 g/dL — ABNORMAL LOW (ref 3.5–5.0)
Albumin: 2.5 g/dL — ABNORMAL LOW (ref 3.5–5.0)
Albumin: 2.2 g/dL — ABNORMAL LOW (ref 3.5–5.0)
Alkaline Phosphatase: 171 U/L — ABNORMAL HIGH (ref 38–126)
Alkaline Phosphatase: 186 U/L — ABNORMAL HIGH (ref 38–126)
Alkaline Phosphatase: 176 U/L — ABNORMAL HIGH (ref 38–126)
Anion gap: 10 (ref 5–15)
Anion gap: 10 (ref 5–15)
Anion gap: 10 (ref 5–15)
BUN: 5 mg/dL — ABNORMAL LOW (ref 6–20)
BUN: <5 mg/dL — ABNORMAL LOW (ref 6–20)
BUN: 6 mg/dL (ref 6–20)
CO2: 18 mmol/L — ABNORMAL LOW (ref 22–32)
CO2: 19 mmol/L — ABNORMAL LOW (ref 22–32)
CO2: 20 mmol/L — ABNORMAL LOW (ref 22–32)
Calcium: 7.8 mg/dL — ABNORMAL LOW (ref 8.9–10.3)
Calcium: 8 mg/dL — ABNORMAL LOW (ref 8.9–10.3)
Calcium: 7.6 mg/dL — ABNORMAL LOW (ref 8.9–10.3)
Chloride: 102 mmol/L (ref 98–111)
Chloride: 99 mmol/L (ref 98–111)
Chloride: 100 mmol/L (ref 98–111)
Creatinine, Ser: 1.13 mg/dL — ABNORMAL HIGH (ref 0.44–1.00)
Creatinine, Ser: 1.35 mg/dL — ABNORMAL HIGH (ref 0.44–1.00)
Creatinine, Ser: 1.45 mg/dL — ABNORMAL HIGH (ref 0.44–1.00)
GFR, Estimated: >60 mL/min (ref >60)
GFR, Estimated: 54 mL/min — ABNORMAL LOW (ref >60)
GFR, Estimated: 49 mL/min — ABNORMAL LOW (ref >60)
Glucose, Bld: 92 mg/dL (ref 70–99)
Glucose, Bld: 95 mg/dL (ref 70–99)
Glucose, Bld: 125 mg/dL — ABNORMAL HIGH (ref 70–99)
Potassium: 4.1 mmol/L (ref 3.5–5.1)
Potassium: 4.1 mmol/L (ref 3.5–5.1)
Potassium: 4.6 mmol/L (ref 3.5–5.1)
Sodium: 130 mmol/L — ABNORMAL LOW (ref 135–145)
Sodium: 128 mmol/L — ABNORMAL LOW (ref 135–145)
Sodium: 130 mmol/L — ABNORMAL LOW (ref 135–145)
Total Bilirubin: 0.5 mg/dL (ref 0.3–1.2)
Total Bilirubin: 0.3 mg/dL (ref 0.3–1.2)
Total Bilirubin: 0.5 mg/dL (ref 0.3–1.2)
Total Protein: 5.6 g/dL — ABNORMAL LOW (ref 6.5–8.1)
Total Protein: 6.7 g/dL (ref 6.5–8.1)
Total Protein: 5.6 g/dL — ABNORMAL LOW (ref 6.5–8.1)

## 2022-10-23 LAB — MAGNESIUM
Magnesium: 8 mg/dL (ref 1.7–2.4)
Magnesium: 9 mg/dL (ref 1.7–2.4)
Magnesium: 6.3 mg/dL (ref 1.7–2.4)

## 2022-10-23 SURGERY — Surgical Case
Anesthesia: Epidural | Site: Abdomen

## 2022-10-23 MED ORDER — FENTANYL CITRATE (PF) 100 MCG/2ML IJ SOLN: 25.0000 ug | INTRAMUSCULAR | Status: DC | PRN

## 2022-10-23 MED ORDER — SCOPOLAMINE 1 MG/3DAYS TD PT72
MEDICATED_PATCH | TRANSDERMAL | Status: AC
  Filled 2022-10-23: qty 1

## 2022-10-23 MED ORDER — LIDOCAINE-EPINEPHRINE (PF) 2 %-1:200000 IJ SOLN
INTRAMUSCULAR | Status: AC
  Filled 2022-10-23: qty 20

## 2022-10-23 MED ORDER — EPHEDRINE 5 MG/ML INJ: 10.0000 mg | INTRAVENOUS | Status: DC | PRN

## 2022-10-23 MED ORDER — LABETALOL HCL 5 MG/ML IV SOLN
40.0000 mg | INTRAVENOUS | Status: DC | PRN
  Administered 2022-10-23 – 2022-10-24 (×2): 40 mg via INTRAVENOUS
  Filled 2022-10-23: qty 8

## 2022-10-23 MED ORDER — SODIUM CHLORIDE 0.9 % IV SOLN
500.0000 mg | INTRAVENOUS | Status: AC
  Administered 2022-10-23: 500 mg via INTRAVENOUS

## 2022-10-23 MED ORDER — ONDANSETRON HCL 4 MG/2ML IJ SOLN
INTRAMUSCULAR | Status: DC | PRN
  Administered 2022-10-23: 4 mg via INTRAVENOUS

## 2022-10-23 MED ORDER — PRENATAL MULTIVITAMIN CH
1.0000 | ORAL_TABLET | Freq: Every day | ORAL | Status: DC
  Administered 2022-10-24 – 2022-10-26 (×3): 1 via ORAL
  Filled 2022-10-23 (×3): qty 1

## 2022-10-23 MED ORDER — DIBUCAINE (PERIANAL) 1 % EX OINT: 1.0000 | TOPICAL_OINTMENT | CUTANEOUS | Status: DC | PRN

## 2022-10-23 MED ORDER — OXYTOCIN-SODIUM CHLORIDE 30-0.9 UT/500ML-% IV SOLN
INTRAVENOUS | Status: DC | PRN
  Administered 2022-10-23: 300 mL via INTRAVENOUS

## 2022-10-23 MED ORDER — ONDANSETRON HCL 4 MG/2ML IJ SOLN: 4.0000 mg | Freq: Three times a day (TID) | INTRAMUSCULAR | Status: DC | PRN

## 2022-10-23 MED ORDER — SOD CITRATE-CITRIC ACID 500-334 MG/5ML PO SOLN
30.0000 mL | ORAL | Status: AC
  Administered 2022-10-23: 30 mL via ORAL

## 2022-10-23 MED ORDER — ONDANSETRON HCL 4 MG/2ML IJ SOLN
INTRAMUSCULAR | Status: AC
  Filled 2022-10-23: qty 2

## 2022-10-23 MED ORDER — TERBUTALINE SULFATE 1 MG/ML IJ SOLN: 0.2500 mg | Freq: Once | INTRAMUSCULAR | Status: DC | PRN

## 2022-10-23 MED ORDER — ACETAMINOPHEN 500 MG PO TABS
1000.0000 mg | ORAL_TABLET | Freq: Four times a day (QID) | ORAL | Status: DC
  Administered 2022-10-24 – 2022-10-26 (×11): 1000 mg via ORAL
  Filled 2022-10-23 (×13): qty 2

## 2022-10-23 MED ORDER — LABETALOL HCL 5 MG/ML IV SOLN
20.0000 mg | INTRAVENOUS | Status: DC | PRN
  Administered 2022-10-23: 20 mg via INTRAVENOUS

## 2022-10-23 MED ORDER — LABETALOL HCL 5 MG/ML IV SOLN: 5.0000 mg | Freq: Once | INTRAVENOUS | Status: DC

## 2022-10-23 MED ORDER — PHENYLEPHRINE 80 MCG/ML (10ML) SYRINGE FOR IV PUSH (FOR BLOOD PRESSURE SUPPORT)
PREFILLED_SYRINGE | INTRAVENOUS | Status: AC
  Filled 2022-10-23: qty 10

## 2022-10-23 MED ORDER — DIPHENHYDRAMINE HCL 50 MG/ML IJ SOLN
6.2500 mg | Freq: Once | INTRAMUSCULAR | Status: AC | PRN
  Administered 2022-10-23: 6.5 mg via INTRAVENOUS
  Filled 2022-10-23: qty 1

## 2022-10-23 MED ORDER — STERILE WATER FOR IRRIGATION IR SOLN
Status: DC | PRN
  Administered 2022-10-23: 1

## 2022-10-23 MED ORDER — FUROSEMIDE 20 MG PO TABS
20.0000 mg | ORAL_TABLET | Freq: Every day | ORAL | Status: DC
  Administered 2022-10-24 – 2022-10-26 (×3): 20 mg via ORAL
  Filled 2022-10-23 (×3): qty 1

## 2022-10-23 MED ORDER — SODIUM CHLORIDE 0.9 % IR SOLN
Status: DC | PRN
  Administered 2022-10-23: 1

## 2022-10-23 MED ORDER — SIMETHICONE 80 MG PO CHEW: 80.0000 mg | CHEWABLE_TABLET | ORAL | Status: DC | PRN

## 2022-10-23 MED ORDER — LIDOCAINE HCL (PF) 1 % IJ SOLN
INTRAMUSCULAR | Status: DC | PRN
  Administered 2022-10-23: 8 mL via EPIDURAL

## 2022-10-23 MED ORDER — PHENYLEPHRINE 80 MCG/ML (10ML) SYRINGE FOR IV PUSH (FOR BLOOD PRESSURE SUPPORT): 80.0000 ug | PREFILLED_SYRINGE | INTRAVENOUS | Status: DC | PRN

## 2022-10-23 MED ORDER — DEXAMETHASONE SODIUM PHOSPHATE 4 MG/ML IJ SOLN
INTRAMUSCULAR | Status: DC | PRN
  Administered 2022-10-23: 8 mg via INTRAVENOUS

## 2022-10-23 MED ORDER — SODIUM CHLORIDE 0.9 % IV SOLN
INTRAVENOUS | Status: AC
  Filled 2022-10-23: qty 5

## 2022-10-23 MED ORDER — SIMETHICONE 80 MG PO CHEW
80.0000 mg | CHEWABLE_TABLET | Freq: Three times a day (TID) | ORAL | Status: DC
  Administered 2022-10-24 – 2022-10-26 (×9): 80 mg via ORAL
  Filled 2022-10-23 (×9): qty 1

## 2022-10-23 MED ORDER — NIFEDIPINE ER OSMOTIC RELEASE 30 MG PO TB24
30.0000 mg | ORAL_TABLET | Freq: Every day | ORAL | Status: DC
  Administered 2022-10-23: 30 mg via ORAL
  Filled 2022-10-23: qty 1

## 2022-10-23 MED ORDER — OXYTOCIN-SODIUM CHLORIDE 30-0.9 UT/500ML-% IV SOLN
1.0000 m[IU]/min | INTRAVENOUS | Status: DC
  Administered 2022-10-23: 2 m[IU]/min via INTRAVENOUS

## 2022-10-23 MED ORDER — KETOROLAC TROMETHAMINE 30 MG/ML IJ SOLN
30.0000 mg | Freq: Once | INTRAMUSCULAR | Status: AC
  Administered 2022-10-23: 30 mg via INTRAVENOUS

## 2022-10-23 MED ORDER — KETOROLAC TROMETHAMINE 30 MG/ML IJ SOLN
30.0000 mg | Freq: Four times a day (QID) | INTRAMUSCULAR | Status: DC
  Administered 2022-10-24 (×2): 30 mg via INTRAVENOUS
  Filled 2022-10-23 (×2): qty 1

## 2022-10-23 MED ORDER — LACTATED RINGERS IV SOLN
500.0000 mL | Freq: Once | INTRAVENOUS | Status: AC
  Administered 2022-10-23: 500 mL via INTRAVENOUS

## 2022-10-23 MED ORDER — WITCH HAZEL-GLYCERIN EX PADS: 1.0000 | MEDICATED_PAD | CUTANEOUS | Status: DC | PRN

## 2022-10-23 MED ORDER — SCOPOLAMINE 1 MG/3DAYS TD PT72
MEDICATED_PATCH | TRANSDERMAL | Status: DC | PRN
  Administered 2022-10-23: 1 via TRANSDERMAL

## 2022-10-23 MED ORDER — FENTANYL CITRATE (PF) 100 MCG/2ML IJ SOLN
INTRAMUSCULAR | Status: AC
  Filled 2022-10-23: qty 2

## 2022-10-23 MED ORDER — DIPHENHYDRAMINE HCL 50 MG/ML IJ SOLN: 12.5000 mg | INTRAMUSCULAR | Status: DC | PRN

## 2022-10-23 MED ORDER — LABETALOL HCL 5 MG/ML IV SOLN: 80.0000 mg | INTRAVENOUS | Status: DC | PRN

## 2022-10-23 MED ORDER — SCOPOLAMINE 1 MG/3DAYS TD PT72: 1.0000 | MEDICATED_PATCH | Freq: Once | TRANSDERMAL | Status: DC

## 2022-10-23 MED ORDER — SODIUM BICARBONATE 8.4 % IV SOLN
INTRAVENOUS | Status: DC | PRN
  Administered 2022-10-23: 5 mL via EPIDURAL

## 2022-10-23 MED ORDER — ACETAMINOPHEN 10 MG/ML IV SOLN
INTRAVENOUS | Status: AC
  Filled 2022-10-23: qty 100

## 2022-10-23 MED ORDER — NALOXONE HCL 0.4 MG/ML IJ SOLN: 0.4000 mg | INTRAMUSCULAR | Status: DC | PRN

## 2022-10-23 MED ORDER — SODIUM BICARBONATE 8.4 % IV SOLN
INTRAVENOUS | Status: AC
  Filled 2022-10-23: qty 50

## 2022-10-23 MED ORDER — PROMETHAZINE HCL 25 MG/ML IJ SOLN: 6.2500 mg | INTRAMUSCULAR | Status: DC | PRN

## 2022-10-23 MED ORDER — SENNOSIDES-DOCUSATE SODIUM 8.6-50 MG PO TABS
2.0000 | ORAL_TABLET | Freq: Every day | ORAL | Status: DC
  Administered 2022-10-24 – 2022-10-26 (×3): 2 via ORAL
  Filled 2022-10-23 (×3): qty 2

## 2022-10-23 MED ORDER — ACETAMINOPHEN 10 MG/ML IV SOLN: 1000.0000 mg | Freq: Once | INTRAVENOUS | Status: DC | PRN

## 2022-10-23 MED ORDER — DIPHENHYDRAMINE HCL 25 MG PO CAPS
25.0000 mg | ORAL_CAPSULE | ORAL | Status: DC | PRN
  Administered 2022-10-23 – 2022-10-24 (×2): 25 mg via ORAL
  Filled 2022-10-23 (×2): qty 1

## 2022-10-23 MED ORDER — ACETAMINOPHEN 10 MG/ML IV SOLN
INTRAVENOUS | Status: AC
  Filled 2022-10-23: qty 500

## 2022-10-23 MED ORDER — ZOLPIDEM TARTRATE 5 MG PO TABS: 5.0000 mg | ORAL_TABLET | Freq: Every evening | ORAL | Status: DC | PRN

## 2022-10-23 MED ORDER — EPHEDRINE 5 MG/ML INJ
10.0000 mg | INTRAVENOUS | Status: DC | PRN
  Filled 2022-10-23: qty 5

## 2022-10-23 MED ORDER — IBUPROFEN 600 MG PO TABS: 600.0000 mg | ORAL_TABLET | Freq: Four times a day (QID) | ORAL | Status: DC

## 2022-10-23 MED ORDER — LABETALOL HCL 5 MG/ML IV SOLN
INTRAVENOUS | Status: AC
  Filled 2022-10-23: qty 4

## 2022-10-23 MED ORDER — NALOXONE HCL 4 MG/10ML IJ SOLN: 1.0000 ug/kg/h | INTRAVENOUS | Status: DC | PRN

## 2022-10-23 MED ORDER — COCONUT OIL OIL: 1.0000 | TOPICAL_OIL | Status: DC | PRN

## 2022-10-23 MED ORDER — AMISULPRIDE (ANTIEMETIC) 5 MG/2ML IV SOLN: 10.0000 mg | Freq: Once | INTRAVENOUS | Status: DC | PRN

## 2022-10-23 MED ORDER — OXYTOCIN-SODIUM CHLORIDE 30-0.9 UT/500ML-% IV SOLN
2.5000 [IU]/h | INTRAVENOUS | Status: AC
  Administered 2022-10-23: 2.5 [IU]/h via INTRAVENOUS
  Filled 2022-10-23: qty 500

## 2022-10-23 MED ORDER — LABETALOL HCL 5 MG/ML IV SOLN
INTRAVENOUS | Status: AC
  Filled 2022-10-23: qty 8

## 2022-10-23 MED ORDER — SODIUM CHLORIDE 0.9% FLUSH: 3.0000 mL | INTRAVENOUS | Status: DC | PRN

## 2022-10-23 MED ORDER — OXYCODONE HCL 5 MG PO TABS
5.0000 mg | ORAL_TABLET | Freq: Once | ORAL | Status: AC | PRN
  Administered 2022-10-23: 5 mg via ORAL

## 2022-10-23 MED ORDER — DEXAMETHASONE SODIUM PHOSPHATE 4 MG/ML IJ SOLN
INTRAMUSCULAR | Status: AC
  Filled 2022-10-23: qty 1

## 2022-10-23 MED ORDER — OXYTOCIN-SODIUM CHLORIDE 30-0.9 UT/500ML-% IV SOLN
INTRAVENOUS | Status: AC
  Filled 2022-10-23: qty 500

## 2022-10-23 MED ORDER — HYDRALAZINE HCL 20 MG/ML IJ SOLN: 10.0000 mg | INTRAMUSCULAR | Status: DC | PRN

## 2022-10-23 MED ORDER — METOCLOPRAMIDE HCL 5 MG/ML IJ SOLN
INTRAMUSCULAR | Status: DC | PRN
  Administered 2022-10-23: 5 mg via INTRAVENOUS

## 2022-10-23 MED ORDER — MENTHOL 3 MG MT LOZG: 1.0000 | LOZENGE | OROMUCOSAL | Status: DC | PRN

## 2022-10-23 MED ORDER — OXYCODONE HCL 5 MG PO TABS
ORAL_TABLET | ORAL | Status: AC
  Filled 2022-10-23: qty 1

## 2022-10-23 MED ORDER — ACETAMINOPHEN 10 MG/ML IV SOLN
INTRAVENOUS | Status: DC | PRN
  Administered 2022-10-23: 1000 mg via INTRAVENOUS

## 2022-10-23 MED ORDER — CEFAZOLIN SODIUM-DEXTROSE 2-4 GM/100ML-% IV SOLN
2.0000 g | INTRAVENOUS | Status: AC
  Administered 2022-10-23: 2 g via INTRAVENOUS

## 2022-10-23 MED ORDER — KETOROLAC TROMETHAMINE 30 MG/ML IJ SOLN
INTRAMUSCULAR | Status: AC
  Filled 2022-10-23: qty 1

## 2022-10-23 MED ORDER — FENTANYL-BUPIVACAINE-NACL 0.5-0.125-0.9 MG/250ML-% EP SOLN
12.0000 mL/h | EPIDURAL | Status: DC | PRN
  Administered 2022-10-23: 12 mL/h via EPIDURAL
  Filled 2022-10-23: qty 250

## 2022-10-23 MED ORDER — ENOXAPARIN SODIUM 40 MG/0.4ML IJ SOSY
40.0000 mg | PREFILLED_SYRINGE | INTRAMUSCULAR | Status: DC
  Administered 2022-10-24 – 2022-10-26 (×3): 40 mg via SUBCUTANEOUS
  Filled 2022-10-23 (×3): qty 0.4

## 2022-10-23 MED ORDER — LIDOCAINE-EPINEPHRINE (PF) 1.5 %-1:200000 IJ SOLN
INTRAMUSCULAR | Status: DC | PRN
  Administered 2022-10-23: 5 mL via EPIDURAL

## 2022-10-23 MED ORDER — GABAPENTIN 100 MG PO CAPS
200.0000 mg | ORAL_CAPSULE | Freq: Every day | ORAL | Status: DC
  Administered 2022-10-23 – 2022-10-25 (×3): 200 mg via ORAL
  Filled 2022-10-23 (×3): qty 2

## 2022-10-23 MED ORDER — MORPHINE SULFATE (PF) 0.5 MG/ML IJ SOLN
INTRAMUSCULAR | Status: DC | PRN
  Administered 2022-10-23: 3 mg via EPIDURAL

## 2022-10-23 MED ORDER — MEPERIDINE HCL 25 MG/ML IJ SOLN: 6.2500 mg | INTRAMUSCULAR | Status: DC | PRN

## 2022-10-23 MED ORDER — LACTATED RINGERS IV SOLN: INTRAVENOUS | Status: DC | PRN

## 2022-10-23 MED ORDER — OXYCODONE HCL 5 MG PO TABS
5.0000 mg | ORAL_TABLET | ORAL | Status: DC | PRN
  Administered 2022-10-24: 5 mg via ORAL
  Administered 2022-10-25: 10 mg via ORAL
  Filled 2022-10-23: qty 1
  Filled 2022-10-23: qty 2

## 2022-10-23 MED ORDER — LACTATED RINGERS IV SOLN: 500.0000 mL | Freq: Once | INTRAVENOUS | Status: DC

## 2022-10-23 MED ORDER — OXYCODONE HCL 5 MG/5ML PO SOLN: 5.0000 mg | Freq: Once | ORAL | Status: AC | PRN

## 2022-10-23 MED ORDER — TRANEXAMIC ACID-NACL 1000-0.7 MG/100ML-% IV SOLN
INTRAVENOUS | Status: DC | PRN
  Administered 2022-10-23: 1000 mg via INTRAVENOUS

## 2022-10-23 MED ORDER — DIPHENHYDRAMINE HCL 25 MG PO CAPS: 25.0000 mg | ORAL_CAPSULE | Freq: Four times a day (QID) | ORAL | Status: DC | PRN

## 2022-10-23 MED ORDER — FENTANYL-BUPIVACAINE-NACL 0.5-0.125-0.9 MG/250ML-% EP SOLN: 12.0000 mL/h | EPIDURAL | Status: DC | PRN

## 2022-10-23 MED ORDER — FENTANYL CITRATE (PF) 100 MCG/2ML IJ SOLN
INTRAMUSCULAR | Status: DC | PRN
  Administered 2022-10-23: 100 ug via EPIDURAL

## 2022-10-23 MED ORDER — MORPHINE SULFATE (PF) 0.5 MG/ML IJ SOLN
INTRAMUSCULAR | Status: AC
  Filled 2022-10-23: qty 10

## 2022-10-23 MED ORDER — METOCLOPRAMIDE HCL 5 MG/ML IJ SOLN
INTRAMUSCULAR | Status: AC
  Filled 2022-10-23: qty 2

## 2022-10-23 SURGICAL SUPPLY — 36 items
BENZOIN TINCTURE PRP APPL 2/3 (GAUZE/BANDAGES/DRESSINGS) IMPLANT
CHLORAPREP W/TINT 26 (MISCELLANEOUS) ×2 IMPLANT
CLAMP UMBILICAL CORD (MISCELLANEOUS) ×1 IMPLANT
CLOTH BEACON ORANGE TIMEOUT ST (SAFETY) ×1 IMPLANT
DRSG OPSITE POSTOP 4X10 (GAUZE/BANDAGES/DRESSINGS) ×1 IMPLANT
ELECT REM PT RETURN 9FT ADLT (ELECTROSURGICAL) ×1
ELECTRODE REM PT RTRN 9FT ADLT (ELECTROSURGICAL) ×1 IMPLANT
EXTRACTOR VACUUM BELL STYLE (SUCTIONS) IMPLANT
GAUZE PAD ABD 7.5X8 STRL (GAUZE/BANDAGES/DRESSINGS) IMPLANT
GAUZE SPONGE 4X4 12PLY STRL LF (GAUZE/BANDAGES/DRESSINGS) IMPLANT
GLOVE BIOGEL PI IND STRL 7.0 (GLOVE) ×2 IMPLANT
GLOVE ECLIPSE 7.0 STRL STRAW (GLOVE) ×1 IMPLANT
GOWN STRL REUS W/TWL LRG LVL3 (GOWN DISPOSABLE) ×2 IMPLANT
KIT ABG SYR 3ML LUER SLIP (SYRINGE) ×1 IMPLANT
NDL HYPO 25X5/8 SAFETYGLIDE (NEEDLE) ×1 IMPLANT
NEEDLE HYPO 25X5/8 SAFETYGLIDE (NEEDLE) ×1 IMPLANT
NS IRRIG 1000ML POUR BTL (IV SOLUTION) ×1 IMPLANT
PACK C SECTION WH (CUSTOM PROCEDURE TRAY) ×1 IMPLANT
PAD OB MATERNITY 4.3X12.25 (PERSONAL CARE ITEMS) ×1 IMPLANT
RTRCTR C-SECT PINK 25CM LRG (MISCELLANEOUS) ×1 IMPLANT
STRIP CLOSURE SKIN 1/2X4 (GAUZE/BANDAGES/DRESSINGS) IMPLANT
SUT MNCRL 0 VIOLET CTX 36 (SUTURE) ×2 IMPLANT
SUT MONOCRYL 0 CTX 36 (SUTURE) ×2
SUT PLAIN 0 NONE (SUTURE) IMPLANT
SUT PLAIN 2 0 (SUTURE)
SUT PLAIN 2 0 XLH (SUTURE) IMPLANT
SUT PLAIN ABS 2-0 CT1 27XMFL (SUTURE) IMPLANT
SUT VIC AB 0 CTX 36 (SUTURE) ×1
SUT VIC AB 0 CTX36XBRD ANBCTRL (SUTURE) ×1 IMPLANT
SUT VIC AB 2-0 CT1 27 (SUTURE)
SUT VIC AB 2-0 CT1 TAPERPNT 27 (SUTURE) IMPLANT
SUT VIC AB 4-0 KS 27 (SUTURE) ×1 IMPLANT
TAPE CLOTH SURG 4X10 WHT LF (GAUZE/BANDAGES/DRESSINGS) IMPLANT
TOWEL OR 17X24 6PK STRL BLUE (TOWEL DISPOSABLE) ×1 IMPLANT
TRAY FOLEY W/BAG SLVR 14FR LF (SET/KITS/TRAYS/PACK) IMPLANT
WATER STERILE IRR 1000ML POUR (IV SOLUTION) ×1 IMPLANT

## 2022-10-23 NOTE — Progress Notes (Signed)
Labor Progress Note Nadeen Dutch is a 32 y.o. G3P0020 at 16w4dpresented for IOL 2/2 severe preE  S: No acute concerns.   O:  BP 130/64   Pulse 90   Temp 98.7 F (37.1 C) (Axillary)   Resp 16   Ht 5' 3"$  (1.6 m)   Wt 84.8 kg   SpO2 98%   BMI 33.11 kg/m  EFM: 110bpm/minimal/no accels, no decels  CVE: Dilation: 4.5 Effacement (%): 40 Cervical Position: Posterior Station: Ballotable Presentation: Vertex Exam by:: SAlden Hipp  A&P: 32y.o. GMP:[id number] for IOL 2/2 severe pre E #Labor: Pitocin started. Consider AROM when fetal head well applied.  #FWB: Cat II, mag induced but has periods of reactivity #GBS positive, PCN ppx  Severe preE  -Mg++ -Continue labetalol 10020mID -CTM  Rodgerick Gilliand Autry-Lott, DO 3:40 AM

## 2022-10-23 NOTE — Anesthesia Procedure Notes (Signed)
Epidural Patient location during procedure: OB Start time: 10/23/2022 1:03 AM End time: 10/23/2022 1:07 AM  Staffing Anesthesiologist: Janeece Riggers, MD  Preanesthetic Checklist Completed: patient identified, IV checked, site marked, risks and benefits discussed, surgical consent, monitors and equipment checked, pre-op evaluation and timeout performed  Epidural Patient position: sitting Prep: DuraPrep and site prepped and draped Patient monitoring: continuous pulse ox and blood pressure Approach: midline Location: L3-L4 Injection technique: LOR air  Needle:  Needle type: Tuohy  Needle gauge: 17 G Needle length: 9 cm and 9 Needle insertion depth: 6 cm Catheter type: closed end flexible Catheter size: 19 Gauge Catheter at skin depth: 12 cm Test dose: negative  Assessment Events: blood not aspirated, no cerebrospinal fluid, injection not painful, no injection resistance, no paresthesia and negative IV test

## 2022-10-23 NOTE — Lactation Note (Signed)
This note was copied from a baby's chart. Lactation Consultation Note  Patient Name: [name] [id number] Date: 10/23/2022   Age:32 hours  Lactation orders acknowledged and received, mom still in PACU at the end of this [place] shift. NICU LC to follow up on 10/25/2022 since there is no lactation coverage for NICU tomorrow.  Ace[place]as 10/23/2022, 6:03 PM

## 2022-10-23 NOTE — Progress Notes (Signed)
   PRENATAL VISIT NOTE  Subjective:  Rachael [name] is a 32 y.o. G3P0020 at 89w5dbeing seen today for additional ob visit due to newly diagnosed PIH.  Here for NST.  She is currently monitored for the following issues for this high-risk pregnancy and has Supervision of high-risk pregnancy, third trimester; Alpha thalassemia silent carrier; Anemia affecting pregnancy in third trimester; Preeclampsia with severe features; and Pre-eclampsia on their problem list.  Patient reports  headache that is relieved by Tylenol.  Has developed PIH.  Protein/creatinine ratio is still pending.  Has great fetal movement.  Will plan NST today.  Lab work reviewed--CMP and CBC.  Denies vaginal bleeding.   .   .  .   . Denies leaking of fluid.   The following portions of the patient's history were reviewed and updated as appropriate: allergies, current medications, past family history, past medical history, past social history, past surgical history and problem list.   Objective:   Vitals:   10/20/22 1000  BP: (!) 146/86    Fetal Status:           General:  Alert, oriented and cooperative. Patient is in no acute distress.  Skin: Skin is warm and dry. No rash noted.   Cardiovascular: Normal heart rate noted  Respiratory: Normal respiratory effort, no problems with respiration noted  Abdomen: Soft, gravid, appropriate for gestational age.        Pelvic: Cervical exam performed in the presence of a chaperone        Extremities: Normal range of motion.   2+ pitting edema  Mental Status: Normal mood and affect. Normal behavior. Normal judgment and thought content.   Assessment and Plan:  Pregnancy: [id number] 376w5d. PIH (pregnancy induced hypertension), third trimester - labs reviewed.  Protein/creatinine ratio still pending. - reactive NST today - precautions for going to MAU discussed.  Persistent headache, visual changes, RUQ pain.  Blood pressure ranges for being seen discussed.  Will follow weekly.   Needs to have BPP next week and will be scheduled weekly until delivery.  Discussed with pt delivery at 37 weeks but am concerned this will need to be sooner due to edema, weight changes and BP changes the past few days.  2. [redacted] weeks gestation of pregnancy  3. Supervision of high-risk pregnancy, third trimester  4. Alpha thalassemia silent carrier - FOB declined testing  5. Anemia affecting pregnancy in third trimester - on oral iron, PNV and baby ASA  Preterm labor symptoms and general obstetric precautions including but not limited to vaginal bleeding, contractions, leaking of fluid and fetal movement were reviewed in detail with the patient. Please refer to After Visit Summary for other counseling recommendations.   Return in about 1 week (around 10/27/2022).  Future Appointments  Date Time Provider DeRio Hondo2/23/2024 11:35 AM MiMegan SalonMD [facility]  11/02/2022  3:55 PM MiMegan SalonMD [facility]  11/09/2022  8:35 AM MiMegan SalonMD [facility]  11/16/2022  3:55 PM MiMegan SalonMD [facility]  11/23/2022  8:15 AM MiMegan SalonMD [facility]    MaMegan SalonMD

## 2022-10-23 NOTE — Progress Notes (Signed)
Epidural Timeout: 0100 Clean:0101 Drape:0101 Injection:0102 Start: 0103 First Dose: 0103 Catheter: 0103 Dressing: [id number]

## 2022-10-23 NOTE — Progress Notes (Signed)
Labor Progress Note Sha Bourdon is a 32 y.o. G3P0020 at 56w4dpresented for IOL 2/2 severe preE  S: No acute concerns. FHR recurrent late decels has been noted. RN requesting an IUPC, as its been difficult to tell how much to titrate pitocin, on the background of recurrent decels.   O:  BP 134/74   Pulse 82   Temp 98.2 F (36.8 C) (Oral)   Resp 14   Ht 5' 3"$  (1.6 m)   Wt 84.8 kg   SpO2 98%   BMI 33.11 kg/m   EFM: 110 bpm, moderate variability, +accels, recurrent late decels noted,   Contractions every 3-4 mins  CVE: Dilation: 5 Effacement (%):  (swollen) Cervical Position: Posterior Station: -1 Presentation: Vertex Exam by:: Dr. NTrina Ao  A&P: 32y.o. GMP:[id number] for IOL 2/2 severe pre E #Labor: slow progress. Cervix appears significantly swollen, likely due to generalized edema from severe pre-eclampsia.  #FWB: Cat II - due to recurrent late decels, however good variability and spontaneous accels are reassuring. #GBS positive, PCN ppx adequate.  Severe preE  -Mg++ -Continue labetalol 10031mID - labs drawn and pending. Will follow up. - consider lasix 16m19m for generalized swelling.  Avyonna Wagoner J NdSherrilyn Rist 9:04 AM

## 2022-10-23 NOTE — Discharge Summary (Signed)
Postpartum Discharge Summary  Date of Service updated***     Patient Name: Rachael [name] DOB: [birth year] MRN: [id number]  Date of admission: 10/21/2022 Delivery date:10/23/2022  Delivering provider: Clarnce Flock  Date of discharge: 10/23/2022  Admitting diagnosis: Pre-eclampsia [O14.90] Intrauterine pregnancy: [redacted]w[redacted]d    Secondary diagnosis:  Principal Problem:   Status post primary low transverse cesarean section Active Problems:   Supervision of high-risk pregnancy, third trimester   Preeclampsia with severe features   Pre-eclampsia  Additional problems: ***    Discharge diagnosis: {DX.:23714}                                              Post partum procedures:{Postpartum procedures:23558} Augmentation: AROM, Pitocin, Cytotec, and IP Foley Complications: {OB Labor/Delivery Complications:20784}  Hospital course: Induction of Labor With Cesarean Section   32y.o. yo [id number] 334w5das admitted to the hospital 10/21/2022 for induction of labor. Patient had a labor course significant for severe pre-eclampsia for which she was on IV magnesium sulfate for seizure prophylaxis. She was noted to have creatinine levels trending up and magnesium reaching supra therapeutic doses, hence magnesium sulfate was held for several hours. The patient went for cesarean section due to  failed IOL and severe pre-eclampsia . Delivery details are as follows: Membrane Rupture Time/Date: 4:15 PM ,10/22/2022   Delivery Method:C-Section, Low Transverse  Details of operation can be found in separate operative Note.  Patient had a postpartum course complicated by***. She is ambulating, tolerating a regular diet, passing flatus, and urinating well.  Patient is discharged home in stable condition on 10/23/22.      Newborn Data: Birth date:10/23/2022  Birth time:4:25 PM  Gender:Female  Living status:Living  Apgars:9 ,9  Weight:2150 g                                Magnesium Sulfate received: Yes:  Seizure prophylaxis BMZ received: No Rhophylac:N/A MMR:N/A, Rubella Immune T-DaP:Given prenatally Flu: Given prenatally Transfusion:{Transfusion received:30440034}  Physical exam  Vitals:   10/23/22 1907 10/23/22 1915 10/23/22 1930 10/23/22 1942  BP: (!) 154/92  (!) 150/89 (!) 156/67  Pulse: 64 69 65 65  Resp: (!) 21 17 17 20  $ Temp:    (!) 97.4 F (36.3 C)  TempSrc:    Oral  SpO2: 96% 98%  96%  Weight:      Height:       General: {Exam; general:21111117} Lochia: {Desc; appropriate/inappropriate:30686::"appropriate"} Uterine Fundus: {Desc; firm/soft:30687} Incision: {Exam; incision:21111123} DVT Evaluation: {Exam; dvt:2111122} Labs: Lab Results  Component Value Date   WBC 15.8 (H) 10/23/2022   HGB 11.0 (L) 10/23/2022   HCT 33.3 (L) 10/23/2022   MCV 86.3 10/23/2022   PLT 211 10/23/2022      Latest Ref Rng & Units 10/23/2022    3:11 PM  CMP  Glucose 70 - 99 mg/dL 95   BUN 6 - 20 mg/dL <5   Creatinine 0.44 - 1.00 mg/dL 1.35   Sodium 135 - 145 mmol/L 128   Potassium 3.5 - 5.1 mmol/L 4.1   Chloride 98 - 111 mmol/L 99   CO2 22 - 32 mmol/L 19   Calcium 8.9 - 10.3 mg/dL 8.0   Total Protein 6.5 - 8.1 g/dL 6.7   Total Bilirubin 0.3 - 1.2 mg/dL 0.3  Alkaline Phos 38 - 126 U/L 186   AST 15 - 41 U/L 28   ALT 0 - 44 U/L 16    Edinburgh Score:     No data to display           After visit meds:  Allergies as of 10/23/2022   No Known Allergies   Med Rec must be completed prior to using this [facility]***        Discharge home in stable condition Infant Feeding: {Baby feeding:23562} Infant Disposition:{CHL IP OB HOME WITH DX:[id number] Discharge instruction: per After Visit Summary and Postpartum booklet. Activity: Advance as tolerated. Pelvic rest for 6 weeks.  Diet: {OB BY:630183 Future Appointments:No future appointments. Follow up Visit:  The following message was sent to [facility] by Mikki Santee, MD  Please schedule this patient for a In person  postpartum visit in 4-6 weeks with the following provider: MD. Additional Postpartum F/U:Incision check 1 week and BP check 1 week  High risk pregnancy complicated by:  severe pre-eclampsia Delivery mode:  C-Section, Low Transverse  Anticipated Birth Control:  Nexplanon outpatient   10/23/2022 Stormy Card, MD

## 2022-10-23 NOTE — Transfer of Care (Signed)
Immediate Anesthesia Transfer of Care Note  Patient: Rachael [name]  Procedure(s) Performed: CESAREAN SECTION (Abdomen)  Patient Location: PACU  Anesthesia Type:Epidural  Level of Consciousness: awake, oriented, drowsy, and patient cooperative  Airway & Oxygen Therapy: Patient Spontanous Breathing  Post-op Assessment: Report given to RN and Post -op Vital signs reviewed and stable  Post vital signs: Reviewed and stable  Last Vitals:  Vitals Value Taken Time  BP 163/106 10/23/22 1727  Temp    Pulse 69 10/23/22 1730  Resp 15 10/23/22 1730  SpO2 97 % 10/23/22 1730  Vitals shown include unvalidated device data.  Last Pain:  Vitals:   10/23/22 1440  TempSrc:   PainSc: 0-No pain      Patients Stated Pain Goal: 3 ([id number]) Complications: Magnesium level 9 mg/dl noted intraoperatively.

## 2022-10-23 NOTE — Anesthesia Preprocedure Evaluation (Addendum)
Anesthesia Evaluation  Patient identified by MRN, date of birth, ID band Patient awake    Reviewed: Allergy & Precautions, H&P , NPO status , Patient's Chart, lab work & pertinent test results, reviewed documented beta blocker date and time   History of Anesthesia Complications (+) DIFFICULT AIRWAY and history of anesthetic complications  Airway Mallampati: II  TM Distance: >3 FB Neck ROM: full    Dental no notable dental hx. (+) Teeth Intact, Dental Advisory Given, Caps   Pulmonary neg pulmonary ROS   Pulmonary exam normal breath sounds clear to auscultation       Cardiovascular hypertension, Pt. on home beta blockers and Pt. on medications negative cardio ROS Normal cardiovascular exam+ Valvular Problems/Murmurs  Rhythm:regular Rate:Normal     Neuro/Psych negative neurological ROS  negative psych ROS   GI/Hepatic negative GI ROS, Neg liver ROS,,,  Endo/Other  negative endocrine ROS    Renal/GU negative Renal ROS  negative genitourinary   Musculoskeletal   Abdominal   Peds  Hematology negative hematology ROS (+) Blood dyscrasia, anemia   Anesthesia Other Findings   Reproductive/Obstetrics (+) Pregnancy                             Anesthesia Physical Anesthesia Plan  ASA: 3  Anesthesia Plan: Epidural   Post-op Pain Management: Minimal or no pain anticipated   Induction: Intravenous  PONV Risk Score and Plan: 2 and Treatment may vary due to age or medical condition  Airway Management Planned: Natural Airway  Additional Equipment: None  Intra-op Plan:   Post-operative Plan:   Informed Consent: I have reviewed the patients History and Physical, chart, labs and discussed the procedure including the risks, benefits and alternatives for the proposed anesthesia with the patient or authorized representative who has indicated his/her understanding and acceptance.     Dental  Advisory Given  Plan Discussed with: Anesthesiologist  Anesthesia Plan Comments: (Labs checked- platelets confirmed with RN in room. Fetal heart tracing, per RN, reported to be stable enough for sitting procedure. Discussed epidural, and patient consents to the procedure:  included risk of possible headache,backache, failed block, allergic reaction, and nerve injury. This patient was asked if she had any questions or concerns before the procedure started.  2015 ER CRNA NOTE Patient Re-evaluated:Patient Re-evaluated prior to inductionOxygen Delivery Method: Ambu bag Preoxygenation: Pre-oxygenation with 100% oxygen Ventilation: Mask ventilation without difficulty and Mask ventilation throughout procedure Grade View: Grade II Tube type: Oral Tube size: 6.5 mm Number of attempts: 5 or more Airway Equipment and Method: Bougie stylet and Video-laryngoscopy Placement Confirmation: ETT inserted through vocal cords under direct vision,  positive ETCO2,  CO2 detector and breath sounds checked- equal and bilateral Secured at: 22 cm Dental Injury: Teeth and Oropharynx as per pre-operative assessment and Bloody posterior oropharynx  Difficulty Due To: Difficulty was anticipated, Difficult Airway- due to anterior larynx and Difficult Airway- due to cervical collar Future Recommendations: Recommend- induction with short-acting agent, and alternative techniques readily available Comments: Called to ED to assist with intubation after multiple attempts by ED physician. Pt unresponsive-had received 168m Rocuronium by ED physician, attempts with c-collar removed by ED staff prior to anesthesia arrival. CRNA attempted with [facility], and Glidescope, unable to pass ETT with stylet or Bougie. Able to mask ventilate throughout procedure. 10 mg Decadron and .282mRobinol given IV. Dr. DeDelma Postalled, arrived in ED. Glidescope used, with bougie stylet thru cords, some difficulty passing ETT, c-collar  removed, neck  stabilization maintained, ETT passed. Placement verified as above. ETT suctioned, some bloody secretions noted. VSS, O2 sats 100%.   )       Anesthesia Quick Evaluation

## 2022-10-23 NOTE — Op Note (Cosign Needed)
Operative Note   Patient: Rachael [name]  Date of Procedure: 10/23/2022  Procedure: Primary Low Transverse Cesarean   Indications: severe preeclampsia and failed induction  Pre-operative Diagnosis: Cesarean section for arrest of dilation, failed induction, severe pre-eclampsia.   Post-operative Diagnosis: Same  TOLAC Candidate: Yes   Surgeon: Surgeon(s) and Role:    * Eckstat, Annice Needy, MD - Primary    * Tanesha Arambula, Lyndel Safe, MD - Fellow  Assistants: Stormy Card, MD   An experienced assistant was required given the standard of surgical care given the complexity of the case.  This assistant was needed for exposure, dissection, suctioning, retraction, instrument exchange, assisting with delivery with administration of fundal pressure, and for overall help during the procedure.   Anesthesia: spinal  Anesthesiologist: No responsible provider has been recorded for the case.   Antibiotics: Cefazolin and Azithromycin   Estimated Blood Loss: 505 ml   Total IV Fluids: 1100 ml  Urine Output:  500 cc OF clear urine  Specimens: placenta to pathology   Complications: no complications   Indications: Rachael [name] is a 32 y.o. [id number] with an IUP 63w5dpresenting for unscheduled, urgent cesarean secondary to the indications listed above. Clinical course notable for IOL for severe pre-eclampsia, on Magnesium, with range above theraeutic, and noted to have elevated creatinine levels.  The risks of cesarean section discussed with the patient included but were not limited to: bleeding which may require transfusion or reoperation; infection which may require antibiotics; injury to bowel, bladder, ureters or other surrounding organs; injury to the fetus; need for additional procedures including hysterectomy in the event of a life-threatening hemorrhage; placental abnormalities with subsequent pregnancies, incisional problems, thromboembolic phenomenon and other postoperative/anesthesia  complications. The patient concurred with the proposed plan, giving informed written consent for the procedure. Patient NPO status waived given urgency of case. Anesthesia and OR aware. Preoperative prophylactic antibiotics and SCDs ordered on call to the OR.   Findings: Viable infant in cephalic presentation, no nuchal; Apgars 9 , 9 , . Weight 2150 g . Clear amniotic fluid. Normal placenta, three vessel cord. Normal uterus, Normal bilateral fallopian tubes, Normal bilateral ovaries.  Procedure Details: A Time Out was held and the above information confirmed. The patient received intravenous antibiotics and had sequential compression devices applied to her lower extremities preoperatively. The patient was taken back to the operative suite where spinal anesthesia was administered. After induction of anesthesia, the patient was draped and prepped in the usual sterile manner and placed in a dorsal supine position with a leftward tilt. A low transverse skin incision was made with scalpel and carried down through the subcutaneous tissue to the fascia. Fascial incision was made and extended transversely. The fascia was separated from the underlying rectus tissue superiorly and inferiorly. The rectus muscles were separated in the midline bluntly and the peritoneum was entered bluntly. An Alexis retractor was placed to aid in visualization of the uterus. A bladder flap was not developed. A low transverse uterine incision was made. The infant was successfully delivered from cephalic presentation, the umbilical cord was clamped after 1 minute. Cord ph was sent, and cord blood was obtained for evaluation. The placenta was removed Intact and appeared normal. The uterine incision was closed with a single layer running unlocked suture of 0-Monocryl. Overall, excellent hemostasis was noted.  The abdomen and pelvis were cleared of all clot and debris and the AUbaldo Glassingwas removed. Hemostasis was confirmed on all surfaces.  The  peritoneum was reapproximated using 2-0  vicryl . The fascia was then closed using 0 Vicryl in a running fashion. The subcutaneous layer was not reapproximated. The skin was closed with a 4-0 vicryl subcuticular stitch. The patient tolerated the procedure well. Sponge, lap, instrument and needle counts were correct x 2. She was taken to the recovery room in stable condition.   Disposition: PACU - hemodynamically stable.    Signed: Liliane Channel MD MPH OB Fellow, [place] [facility] 10/23/2022

## 2022-10-23 NOTE — Plan of Care (Signed)
Problem: Education: Goal: Knowledge of disease or condition will improve 10/23/2022 0650 by Sherlynn Carbon, RN Outcome: Progressing 10/22/2022 2043 by Sherlynn Carbon, RN Outcome: Progressing Goal: Knowledge of the prescribed therapeutic regimen will improve 10/23/2022 0650 by Sherlynn Carbon, RN Outcome: Progressing 10/22/2022 2043 by Sherlynn Carbon, RN Outcome: Progressing   Problem: Fluid Volume: Goal: Peripheral tissue perfusion will improve 10/23/2022 0650 by Sherlynn Carbon, RN Outcome: Progressing 10/22/2022 2043 by Sherlynn Carbon, RN Outcome: Progressing   Problem: Clinical Measurements: Goal: Complications related to disease process, condition or treatment will be avoided or minimized 10/23/2022 0650 by Sherlynn Carbon, RN Outcome: Progressing 10/22/2022 2043 by Sherlynn Carbon, RN Outcome: Progressing   Problem: Education: Goal: Knowledge of Childbirth will improve 10/23/2022 0650 by Sherlynn Carbon, RN Outcome: Progressing 10/22/2022 2043 by Sherlynn Carbon, RN Outcome: Progressing Goal: Ability to make informed decisions regarding treatment and plan of care will improve 10/23/2022 0650 by Sherlynn Carbon, RN Outcome: Progressing 10/22/2022 2043 by Sherlynn Carbon, RN Outcome: Progressing Goal: Ability to state and carry out methods to decrease the pain will improve 10/23/2022 0650 by Sherlynn Carbon, RN Outcome: Progressing 10/22/2022 2043 by Sherlynn Carbon, RN Outcome: Progressing Goal: Individualized Educational Video(s) 10/23/2022 0650 by Sherlynn Carbon, RN Outcome: Progressing 10/22/2022 2043 by Sherlynn Carbon, RN Outcome: Progressing   Problem: Coping: Goal: Ability to verbalize concerns and feelings about labor and delivery will improve 10/23/2022 0650 by Sherlynn Carbon, RN Outcome: Progressing 10/22/2022 2043 by Sherlynn Carbon, RN Outcome: Progressing   Problem: Life Cycle: Goal: Ability to make normal progression  through stages of labor will improve 10/23/2022 0650 by Sherlynn Carbon, RN Outcome: Progressing 10/22/2022 2043 by Sherlynn Carbon, RN Outcome: Progressing Goal: Ability to effectively push during vaginal delivery will improve 10/23/2022 0650 by Sherlynn Carbon, RN Outcome: Progressing 10/22/2022 2043 by Sherlynn Carbon, RN Outcome: Progressing   Problem: Role Relationship: Goal: Will demonstrate positive interactions with the child 10/23/2022 0650 by Sherlynn Carbon, RN Outcome: Progressing 10/22/2022 2043 by Sherlynn Carbon, RN Outcome: Progressing   Problem: Safety: Goal: Risk of complications during labor and delivery will decrease 10/23/2022 0650 by Sherlynn Carbon, RN Outcome: Progressing 10/22/2022 2043 by Sherlynn Carbon, RN Outcome: Progressing   Problem: Pain Management: Goal: Relief or control of pain from uterine contractions will improve 10/23/2022 0650 by Sherlynn Carbon, RN Outcome: Progressing 10/22/2022 2043 by Sherlynn Carbon, RN Outcome: Progressing   Problem: Education: Goal: Knowledge of General Education information will improve Description: Including pain rating scale, medication(s)/side effects and non-pharmacologic comfort measures 10/23/2022 0650 by Sherlynn Carbon, RN Outcome: Progressing 10/22/2022 2043 by Sherlynn Carbon, RN Outcome: Progressing   Problem: Health Behavior/Discharge Planning: Goal: Ability to manage health-related needs will improve 10/23/2022 0650 by Sherlynn Carbon, RN Outcome: Progressing 10/22/2022 2043 by Sherlynn Carbon, RN Outcome: Progressing   Problem: Clinical Measurements: Goal: Ability to maintain clinical measurements within normal limits will improve 10/23/2022 0650 by Sherlynn Carbon, RN Outcome: Progressing 10/22/2022 2043 by Sherlynn Carbon, RN Outcome: Progressing Goal: Will remain free from infection 10/23/2022 0650 by Sherlynn Carbon, RN Outcome: Progressing 10/22/2022 2043 by  Sherlynn Carbon, RN Outcome: Progressing Goal: Diagnostic test results will improve 10/23/2022 0650 by Sherlynn Carbon, RN Outcome: Progressing 10/22/2022 2043 by Sherlynn Carbon, RN Outcome: Progressing Goal: Respiratory complications will improve 10/23/2022 0650 by Sherlynn Carbon, RN Outcome:  Progressing 10/22/2022 2043 by Sherlynn Carbon, RN Outcome: Progressing Goal: Cardiovascular complication will be avoided 10/23/2022 0650 by Sherlynn Carbon, RN Outcome: Progressing 10/22/2022 2043 by Sherlynn Carbon, RN Outcome: Progressing   Problem: Activity: Goal: Risk for activity intolerance will decrease 10/23/2022 0650 by Sherlynn Carbon, RN Outcome: Progressing 10/22/2022 2043 by Sherlynn Carbon, RN Outcome: Progressing   Problem: Nutrition: Goal: Adequate nutrition will be maintained 10/23/2022 0650 by Sherlynn Carbon, RN Outcome: Progressing 10/22/2022 2043 by Sherlynn Carbon, RN Outcome: Progressing   Problem: Coping: Goal: Level of anxiety will decrease 10/23/2022 0650 by Sherlynn Carbon, RN Outcome: Progressing 10/22/2022 2043 by Sherlynn Carbon, RN Outcome: Progressing   Problem: Elimination: Goal: Will not experience complications related to bowel motility 10/23/2022 0650 by Sherlynn Carbon, RN Outcome: Progressing 10/22/2022 2043 by Sherlynn Carbon, RN Outcome: Progressing Goal: Will not experience complications related to urinary retention 10/23/2022 0650 by Sherlynn Carbon, RN Outcome: Progressing 10/22/2022 2043 by Sherlynn Carbon, RN Outcome: Progressing   Problem: Pain Managment: Goal: General experience of comfort will improve 10/23/2022 0650 by Sherlynn Carbon, RN Outcome: Progressing 10/22/2022 2043 by Sherlynn Carbon, RN Outcome: Progressing   Problem: Safety: Goal: Ability to remain free from injury will improve 10/23/2022 0650 by Sherlynn Carbon, RN Outcome: Progressing 10/22/2022 2043 by Sherlynn Carbon,  RN Outcome: Progressing   Problem: Skin Integrity: Goal: Risk for impaired skin integrity will decrease 10/23/2022 0650 by Sherlynn Carbon, RN Outcome: Progressing 10/22/2022 2043 by Sherlynn Carbon, RN Outcome: Progressing

## 2022-10-23 NOTE — Progress Notes (Signed)
In to see patient. Labor course to date has been protracted. She had spontaneous rupture of membranes close to 24h ago. She has been on and off pitocin due to late decelerations, but has consistently been on it for the past 12 hours. Patient has had IUPC in place since around 0900 today, she has at time achieved adequate contractions but overall she has been inadequate the majority of the time. She has been 5 cm since at least 0530 this morning, around 10 hours. At present patient has low FHR baseline of 105 and has had this for about the past four hours, though variability has remained reassuring. Labs around 0900 today showed rising Cr to 1.13 and Mg level of 8.0; due to this I paused the magensium infusion 1.5 hours ago and I am rechecking labs right now.    On current recheck patient remains about 5 cm, anterior lip of cervix is very swollen, and station remains high around -1 to -2. I discussed with patient that given her lack of cervical progress, worsening signs of pre-eclampsia, and baseline change in FHR, I would recommend that we proceed with primary cesarean for failed induction in setting of worsening severe pre-eclampsia. She was amenable to this plan.   The risks of cesarean section discussed with the patient included but were not limited to: bleeding which may require transfusion or reoperation; infection which may require antibiotics; injury to bowel, bladder, ureters or other surrounding organs; injury to the fetus; need for additional procedures including hysterectomy in the event of a life-threatening hemorrhage; placental abnormalities with subsequent pregnancies, incisional problems, thromboembolic phenomenon and other postoperative/anesthesia complications. The patient concurred with the proposed plan, giving informed written consent for the procedure. Patient NPO status waived given urgency of case. Anesthesia and OR aware. Preoperative prophylactic antibiotics and SCDs ordered on call to  the OR.    Clarnce Flock, MD/MPH Attending Family Medicine Physician, [facility] for [facility], [place]  3:28 PM 10/23/22

## 2022-10-24 LAB — COMPREHENSIVE METABOLIC PANEL
ALT: 14 U/L (ref 0–44)
AST: 34 U/L (ref 15–41)
Albumin: 2.2 g/dL — ABNORMAL LOW (ref 3.5–5.0)
Alkaline Phosphatase: 162 U/L — ABNORMAL HIGH (ref 38–126)
Anion gap: 11 (ref 5–15)
BUN: 9 mg/dL (ref 6–20)
CO2: 21 mmol/L — ABNORMAL LOW (ref 22–32)
Calcium: 7.8 mg/dL — ABNORMAL LOW (ref 8.9–10.3)
Chloride: 100 mmol/L (ref 98–111)
Creatinine, Ser: 1.76 mg/dL — ABNORMAL HIGH (ref 0.44–1.00)
GFR, Estimated: 39 mL/min — ABNORMAL LOW (ref >60)
Glucose, Bld: 104 mg/dL — ABNORMAL HIGH (ref 70–99)
Potassium: 4.9 mmol/L (ref 3.5–5.1)
Sodium: 132 mmol/L — ABNORMAL LOW (ref 135–145)
Total Bilirubin: 0.5 mg/dL (ref 0.3–1.2)
Total Protein: 5.1 g/dL — ABNORMAL LOW (ref 6.5–8.1)

## 2022-10-24 LAB — CBC
HCT: 29 % — ABNORMAL LOW (ref 36.0–46.0)
Hemoglobin: 9.6 g/dL — ABNORMAL LOW (ref 12.0–15.0)
MCH: 28.7 pg (ref 26.0–34.0)
MCHC: 33.1 g/dL (ref 30.0–36.0)
MCV: 86.8 fL (ref 80.0–100.0)
Platelets: 221 10*3/uL (ref 150–400)
RBC: 3.34 MIL/uL — ABNORMAL LOW (ref 3.87–5.11)
RDW: 15.6 % — ABNORMAL HIGH (ref 11.5–15.5)
WBC: 19.9 10*3/uL — ABNORMAL HIGH (ref 4.0–10.5)
nRBC: 0 % (ref 0.0–0.2)

## 2022-10-24 LAB — MAGNESIUM: Magnesium: 5.4 mg/dL — ABNORMAL HIGH (ref 1.7–2.4)

## 2022-10-24 MED ORDER — LACTATED RINGERS IV SOLN: INTRAVENOUS | Status: DC

## 2022-10-24 MED ORDER — NIFEDIPINE ER OSMOTIC RELEASE 60 MG PO TB24
60.0000 mg | ORAL_TABLET | Freq: Every day | ORAL | Status: DC
  Administered 2022-10-24 – 2022-10-26 (×3): 60 mg via ORAL
  Filled 2022-10-24 (×3): qty 1

## 2022-10-24 MED ORDER — NIFEDIPINE ER OSMOTIC RELEASE 30 MG PO TB24
30.0000 mg | ORAL_TABLET | Freq: Once | ORAL | Status: AC
  Administered 2022-10-24: 30 mg via ORAL
  Filled 2022-10-24: qty 1

## 2022-10-24 MED ORDER — MAGNESIUM SULFATE BOLUS VIA INFUSION
4.0000 g | Freq: Once | INTRAVENOUS | Status: AC
  Administered 2022-10-24: 4 g via INTRAVENOUS
  Filled 2022-10-24: qty 1000

## 2022-10-24 MED ORDER — MAGNESIUM SULFATE 40 GM/1000ML IV SOLN
1.0000 g/h | INTRAVENOUS | Status: AC
  Administered 2022-10-24: 1 g/h via INTRAVENOUS
  Filled 2022-10-24: qty 1000

## 2022-10-24 NOTE — Anesthesia Postprocedure Evaluation (Signed)
Anesthesia Post Note  Patient: Rachael [name]  Procedure(s) Performed: CESAREAN SECTION (Abdomen)     Patient location during evaluation: PACU Anesthesia Type: Epidural Level of consciousness: responds to stimulation Pain management: pain level controlled Vital Signs Assessment: post-procedure vital signs reviewed and stable Respiratory status: spontaneous breathing, nonlabored ventilation and respiratory function stable Cardiovascular status: blood pressure returned to baseline and stable Postop Assessment: no apparent nausea or vomiting Anesthetic complications: no   No notable events documented.  Last Vitals:  Vitals:   10/24/22 0417 10/24/22 0420  BP: 138/72 126/70  Pulse:    Resp:    Temp:    SpO2:      Last Pain:  Vitals:   10/24/22 0010  TempSrc:   PainSc: 0-No pain                 Elidia Bonenfant P Tayli Buch

## 2022-10-24 NOTE — Progress Notes (Signed)
Subjective: Postpartum Day 1: Cesarean Delivery Patient reports feeling well with minimal pain.  She ambulated. She denies HA, visual changes or RUQ pain  Objective: Vital signs in last 24 hours: Temp:  [97.4 F (36.3 C)-97.9 F (36.6 C)] 97.9 F (36.6 C) (02/19 2359) Pulse Rate:  [56-101] 60 (02/20 0311) Resp:  [13-29] 18 (02/19 2359) BP: (117-169)/(52-112) 126/70 (02/20 0420) SpO2:  [93 %-100 %] 98 % (02/19 2359)  Physical Exam:  General: alert, cooperative, and no distress Lochia: appropriate Uterine Fundus: firm Incision: Pressure dressing removed. Honey comb dressing saturated- will change it DVT Evaluation: No evidence of DVT seen on physical exam.  Recent Labs    10/23/22 2116 10/24/22 0522  HGB 10.8* 9.6*  HCT 32.0* 29.0*    Assessment/Plan: Status post Cesarean section. Doing well postoperatively.  Patient with severe pre-eclampsia with magnesium sulfate held secondary to high magnesium level. Magnesium level this morning is subtherapeutic at 5, will restart magnesium at 1 gm/hour for seizure prophylaxis.  Continue procardia and lasiz for BP control Continue monitoring serum creatinine Continue postpartum care  Mora Bellman, MD 10/24/2022, 10:59 AM

## 2022-10-25 LAB — CREATININE, SERUM
Creatinine, Ser: 1.21 mg/dL — ABNORMAL HIGH (ref 0.44–1.00)
GFR, Estimated: >60 mL/min (ref >60)

## 2022-10-25 LAB — SURGICAL PATHOLOGY

## 2022-10-25 MED ORDER — FERROUS SULFATE 325 (65 FE) MG PO TABS
325.0000 mg | ORAL_TABLET | ORAL | Status: DC
  Administered 2022-10-25: 325 mg via ORAL
  Filled 2022-10-25: qty 1

## 2022-10-25 NOTE — Lactation Note (Signed)
This note was copied from a baby's chart.  NICU Lactation Consultation Note  Patient Name: Rachael [name] [id number] Date: 10/25/2022 Age:32 hours   Subjective Reason for consult: Initial assessment; Primapara; 1st time breastfeeding; NICU baby; Late-preterm 34-36.6wks; Infant < 6lbs  LC visited with P1 Mom of Rachael [name] in the NICU.  Baby Rachael "Rachael [name]", is currently on CPAP at 21% and receiving NG feeds of  24 cal fortified DBM.  Mom was on MgSO4 yesterday.  Mom states she was set up with a DEBP last evening and assisted to pump.  EBM was taken to NICU for oral care.  Hand's free pumping band provided and assisted Mom to pump every 2-3 hrs when awaken, on initiation setting until milk volume is >20 ml when pumping.  Mom to use maintenance setting at that time.  Encouraged STS with baby as much as possible, pumping often with goal of 8 times per 24 hrs.  Mom received her employee pump (Spectra S2)  Mom aware of lactation support available and encouraged asking for help.   Objective Infant data: Mother's Current Feeding Choice: Breast Milk and Donor Milk  Infant feeding assessment Scale for Readiness: 3     Maternal data: [id number]  C-Section, Low Transverse Significant Breast History:: ++breast changes  Current breast feeding challenges:: Infant separation  No data recorded Does the patient have breastfeeding experience prior to this delivery?: No  Pumping frequency: Mom has pumped once yesterday evening.  Teaching on importance of pumping every 2-3 hrs when awake Pumped volume: 2 mL Flange Size: 21  Risk factor for low milk supply:: late preterm infant in the NICU/<5 lbs   Pump: Employee Pump (Spectra S2)  Assessment Infant: No data recorded Feeding Status: NPO   Maternal: Milk volume: Normal   Intervention/Plan Interventions: Breast feeding basics reviewed; Skin to skin; Breast massage; Hand express; DEBP; Education; [employer] brochure  Tools: Pump;  Flanges; Hands-free pumping top Pump Education: Setup, frequency, and cleaning; Milk Storage  Plan: Consult Status: NICU follow-up  NICU Follow-up type: New admission follow up    Rachael [name] 10/25/2022, 10:28 AM

## 2022-10-25 NOTE — Progress Notes (Signed)
Patient screened out for psychosocial assessment since none of the following apply: Psychosocial stressors documented in mother or baby's chart Gestation less than 32 weeks Code at delivery  Infant with anomalies Please contact the Clinical Social Worker if specific needs arise or by MOB's request.  Flavia Shipper Postpartum Depression Score is 3.  Laurey Arrow, MSW, LCSW Clinical Social Work [phone]

## 2022-10-25 NOTE — Progress Notes (Signed)
Subjective: Postpartum Day 2: Cesarean Delivery Patient reports feeling well with minimal pain.  She ambulated. She denies HA, visual changes or RUQ pain. She is passing gas. Voiding without issue.   Objective: Vital signs in last 24 hours: Temp:  [97.8 F (36.6 C)-99.1 F (37.3 C)] 98.3 F (36.8 C) (02/21 0341) Pulse Rate:  [66-99] 99 (02/21 0341) Resp:  [18-20] 18 (02/21 0341) BP: (114-154)/(57-114) 131/60 (02/21 0341) SpO2:  [93 %-100 %] 100 % (02/21 0341) UOP >60 cc/hr  Physical Exam:  General: alert, cooperative, and no distress Lochia: appropriate Uterine Fundus: firm Incision:  Honey comb dressing with some blood staining on the right. DVT Evaluation: No evidence of DVT seen on physical exam. + Edema but equal bilaterally.   Recent Labs    10/23/22 2116 10/24/22 0522  HGB 10.8* 9.6*  HCT 32.0* 29.0*    Assessment/Plan: Postop Status post Cesarean section.  - Doing well postoperatively. Meeting postop goals - Plan is for Nexplanon outpt ([facility] insurance) - Breastfeeding - Rh pos, RI - She had flu shot and tdap   Severe Preeclampsia - BP well controlled on Procardia and Lasix.  - s/p Magnesium - Babyscripts initiated for postpartum and will plan on meds 2 bed.   Acute kidney injury - Due to preeclampsia. Improving at 1.2 today. UOP excellent.   Iron deficiency anemia - Due to blood loss from surgery. Will do iron every other day.   Disposition - anticipate d/c home (NICU) tomorrow.    Radene Gunning, MD 10/25/2022, 8:02 AM

## 2022-10-26 ENCOUNTER — Other Ambulatory Visit (HOSPITAL_COMMUNITY): Payer: Self-pay

## 2022-10-26 MED ORDER — NIFEDIPINE ER 60 MG PO TB24
60.0000 mg | ORAL_TABLET | Freq: Every day | ORAL | 1 refills | Status: DC
  Filled 2022-10-26: qty 30, 30d supply, fill #0

## 2022-10-26 MED ORDER — FUROSEMIDE 20 MG PO TABS
20.0000 mg | ORAL_TABLET | Freq: Every day | ORAL | 0 refills | Status: DC
  Filled 2022-10-26: qty 3, 3d supply, fill #0

## 2022-10-26 MED ORDER — ACETAMINOPHEN 500 MG PO TABS
1000.0000 mg | ORAL_TABLET | Freq: Three times a day (TID) | ORAL | 0 refills | Status: AC | PRN
  Filled 2022-10-26: qty 30, 5d supply, fill #0

## 2022-10-26 MED ORDER — FERROUS SULFATE 325 (65 FE) MG PO TABS
325.0000 mg | ORAL_TABLET | ORAL | 0 refills | Status: DC
  Filled 2022-10-26: qty 30, 60d supply, fill #0

## 2022-10-26 MED ORDER — OXYCODONE HCL 5 MG PO TABS
5.0000 mg | ORAL_TABLET | ORAL | 0 refills | Status: DC | PRN
  Filled 2022-10-26: qty 20, 2d supply, fill #0

## 2022-10-26 NOTE — Progress Notes (Signed)
Patient discharged, ambulatory off unit with significant other. Patient given discharge instructions and denies any questions or concerns.

## 2022-10-26 NOTE — Lactation Note (Signed)
This note was copied from a baby's chart.  NICU Lactation Consultation Note  Patient Name: Rachael [name] [id number] Date: 10/26/2022 Age:32 hours  Subjective Reason for consult: Follow-up assessment; NICU baby; Primapara; 1st time breastfeeding; Late-preterm 34-36.6wks; Maternal discharge  Visited with family of 10 hours LPI NICU female; Rachael [name] is a P1 and reports her milk is in. Noticed she hasn't been pumping consistently; explained the importance of consistent pumping for the prevention of engorgement and to protect her supply, she voiced understanding. She's getting discharge today. Reviewed discharge education, engorgement prevention/treatment, pumping schedule, pump settings, lactogenesis II/III and anticipatory guidelines.   Objective Infant data: Mother's Current Feeding Choice: Breast Milk and Donor Milk  Infant feeding assessment Scale for Readiness: 2  Maternal data: [id number]  C-Section, Low Transverse Significant Breast History:: ++breast changes  Current breast feeding challenges:: NICU admission  Does the patient have breastfeeding experience prior to this delivery?: No  Pumping frequency: 4 times/24 hours Pumped volume: 30 mL Flange Size: 21  Risk factor for low milk supply:: primipara, prematurity, infant separation, 505 cc. blood loss, infrequent pumping as 10/26/2022   Pump: Employee Pump (Spectra S2) (Per previous LC)  Assessment Infant: Feeding Status: -- (Scheduled feedings)  Maternal: Milk volume: Normal  Intervention/Plan Interventions: Breast feeding basics reviewed; DEBP; Education  Tools: Pump; Flanges; Hands-free pumping top Pump Education: Setup, frequency, and cleaning; Milk Storage  Plan of care: Encouraged bilateral pumping every 3 hours, ideally 8 pumping sessions/24 hours She'll take all pump parts to baby's room after her discharge She'll switch her pump settings from initiation to expression mode  No other support person  at this time. All questions and concerns answered;  family to contact [facility] services PRN.  Consult Status: NICU follow-up  NICU Follow-up type: Verify onset of copious milk; Verify absence of engorgement; Weekly NICU follow up   Rachael [name] 10/26/2022, 4:38 PM

## 2022-10-27 ENCOUNTER — Encounter (HOSPITAL_BASED_OUTPATIENT_CLINIC_OR_DEPARTMENT_OTHER): Payer: Self-pay | Admitting: Obstetrics & Gynecology

## 2022-10-28 ENCOUNTER — Ambulatory Visit: Payer: Self-pay

## 2022-10-28 NOTE — Lactation Note (Signed)
This note was copied from a baby's chart. Lactation Consultation Note  Patient Name: Rachael [name] [id number] Date: 10/28/2022   Age:32 days  LC checked with RN and asked her to notify [facility] when Mom arrives.  RN states Mom was planning on coming to visit at 8 pm.  Consult Status  [place] will follow-up at another time    Broadus John 10/28/2022, 3:14 PM

## 2022-11-02 ENCOUNTER — Encounter (HOSPITAL_BASED_OUTPATIENT_CLINIC_OR_DEPARTMENT_OTHER): Payer: No Typology Code available for payment source | Admitting: Obstetrics & Gynecology

## 2022-11-02 ENCOUNTER — Ambulatory Visit (INDEPENDENT_AMBULATORY_CARE_PROVIDER_SITE_OTHER): Payer: 59 | Admitting: *Deleted

## 2022-11-02 VITALS — BP 134/71 | HR 105 | Ht 63.0 in | Wt 158.0 lb

## 2022-11-02 DIAGNOSIS — Z0130 Encounter for examination of blood pressure without abnormal findings: Secondary | ICD-10-CM

## 2022-11-02 DIAGNOSIS — Z9889 Other specified postprocedural states: Secondary | ICD-10-CM

## 2022-11-02 NOTE — Progress Notes (Signed)
Pt here for BP and incision check, S/P cesarean section on 10/23/22.  BP improved. Pt taking medication and denies symptoms. Incision clean and dry with slight separation in the middle. Reinforced with steri strips. Advised pt, no lifting, pushing, pulling more than 10 lbs. Pt to contact office with any concerns.

## 2022-11-04 ENCOUNTER — Ambulatory Visit: Payer: Self-pay

## 2022-11-04 NOTE — Lactation Note (Signed)
This note was copied from a baby's chart.  NICU Lactation Consultation Note  Patient Name: Rachael [name] [id number] Date: 11/04/2022 Age:32 days  Subjective Reason for consult: Weekly NICU follow-up; Primapara; 1st time breastfeeding; NICU baby; Late-preterm 34-36.6wks; Other (Comment) ([employer] employee)  Visited with family of 23 43/30 weeks old [place] NICU female; Rachael [name] is a P1 and reports she's pumping more often and her supply has increased; praised her for her efforts. She's also using the "pump log" app to help her keep track of her pumping sessions. She' still unsure about taking baby to breast, she has decided to pump and bottle feed for the moment, but might change her mind later. Baby is currently undergoing treatment for oral thrush, once thrush clears, lactation will re-visit with Rachael [name] to see if she might want to take baby to breast or continue to exclusively pump and bottle feed. She had some questions regarding galactagogues. Reviewed pumping schedule, pumping log, lactogenesis III, supply/demand and strategies to increase supply.  Objective Infant data: Mother's Current Feeding Choice: Breast Milk and Donor Milk  Infant feeding assessment Scale for Readiness: 2 Scale for Quality: 3  Maternal data: [id number]  C-Section, Low Transverse Pumping frequency: 6 times/24 hours Pumped volume: 118 mL (118-140 ml.) Flange Size: 21 Pump: Employee Pump (Spectra S2)  Assessment Infant: Feeding Status: -- (Scheduled feedings)  Maternal: Milk volume: Normal  Intervention/Plan Interventions: Breast feeding basics reviewed; DEBP; Education  Tools: Pump; Flanges Pump Education: Setup, frequency, and cleaning; Milk Storage  Plan of care: Encouraged to continue bilateral pumping every 3 hours, ideally 8 pumping sessions/24 hours She'll start power pumping in the AM She'll try oatmeal cookies but she's aware that it may not work for every individual She'll call NICU LC for  assistance if she decides to take baby to breast   Mom's sister present and supportive. All questions and concerns answered;  family to contact [facility] services PRN.  Consult Status: NICU follow-up  NICU Follow-up type: Weekly NICU follow up   [place] 11/04/2022, 4:29 PM

## 2022-11-09 ENCOUNTER — Encounter (HOSPITAL_BASED_OUTPATIENT_CLINIC_OR_DEPARTMENT_OTHER): Payer: No Typology Code available for payment source | Admitting: Obstetrics & Gynecology

## 2022-11-16 ENCOUNTER — Encounter (HOSPITAL_BASED_OUTPATIENT_CLINIC_OR_DEPARTMENT_OTHER): Payer: No Typology Code available for payment source | Admitting: Obstetrics & Gynecology

## 2022-11-23 ENCOUNTER — Encounter (HOSPITAL_BASED_OUTPATIENT_CLINIC_OR_DEPARTMENT_OTHER): Payer: No Typology Code available for payment source | Admitting: Obstetrics & Gynecology

## 2022-12-05 ENCOUNTER — Encounter (HOSPITAL_BASED_OUTPATIENT_CLINIC_OR_DEPARTMENT_OTHER): Payer: Self-pay | Admitting: Advanced Practice Midwife

## 2022-12-05 ENCOUNTER — Ambulatory Visit (INDEPENDENT_AMBULATORY_CARE_PROVIDER_SITE_OTHER): Payer: 59 | Admitting: Advanced Practice Midwife

## 2022-12-05 DIAGNOSIS — Z8759 Personal history of other complications of pregnancy, childbirth and the puerperium: Secondary | ICD-10-CM | POA: Insufficient documentation

## 2022-12-05 DIAGNOSIS — Z30017 Encounter for initial prescription of implantable subdermal contraceptive: Secondary | ICD-10-CM

## 2022-12-05 DIAGNOSIS — O1493 Unspecified pre-eclampsia, third trimester: Secondary | ICD-10-CM

## 2022-12-05 MED ORDER — ETONOGESTREL 68 MG ~~LOC~~ IMPL
68.0000 mg | DRUG_IMPLANT | Freq: Once | SUBCUTANEOUS | Status: AC
  Administered 2022-12-05: 68 mg via SUBCUTANEOUS

## 2022-12-05 NOTE — Progress Notes (Signed)
Rachael [name] Visit Note  Rachael [name] is a 32 y.o. [phone] female who presents for a postpartum visit. She is 6 weeks postpartum following a primary cesarean section.  I have fully reviewed the prenatal and intrapartum course. The delivery was at 34 gestational weeks.  Anesthesia: epidural. Postpartum course has been normal. Baby is doing well. Baby is feeding by  breast and formula . Bleeding no bleeding. Bowel function is normal. Bladder function is normal. Patient is sexually active. Contraception method is none. Postpartum depression screening: negative.   The pregnancy intention screening data noted above was reviewed. Potential methods of contraception were discussed. The patient elected to proceed with No data recorded.   Edinburgh Postnatal Depression Scale - 12/05/22 0941       Edinburgh Postnatal Depression Scale:  In the Past 7 Days   I have been able to laugh and see the funny side of things. 0    I have looked forward with enjoyment to things. 0    I have blamed myself unnecessarily when things went wrong. 0    I have been anxious or worried for no good reason. 1    I have felt scared or panicky for no good reason. 1    Things have been getting on top of me. 1    I have been so unhappy that I have had difficulty sleeping. 0    I have felt sad or miserable. 0    I have been so unhappy that I have been crying. 0    The thought of harming myself has occurred to me. 0    Edinburgh Postnatal Depression Scale Total 3             Health Maintenance Due  Topic Date Due   PAP SMEAR-Modifier  Never done   COVID-19 Vaccine (4 - 2023-24 season) 05/05/2022    The following portions of the patient's history were reviewed and updated as appropriate: allergies, current medications, past family history, past medical history, past social history, past surgical history, and problem list.  Review of Systems Pertinent items noted in HPI and remainder of comprehensive ROS  otherwise negative.  Objective:  BP (!) 122/55   Pulse 73   Ht 5\' 3"  (1.6 m)   Wt 154 lb 9.6 oz (70.1 kg)   LMP 11/28/2022   Breastfeeding Yes   BMI 27.39 kg/m    VS reviewed, nursing note reviewed,  Constitutional: well developed, well nourished, no distress HEENT: normocephalic CV: normal rate Pulm/chest wall: normal effort Abdomen: soft Neuro: alert and oriented x 3 Skin: warm, dry Psych: affect normal     Nexplanon Insertion Procedure Patient identified, informed consent performed, consent signed.   Patient does understand that irregular bleeding is a very common side effect of this medication. She was advised to have backup contraception for one week after placement. Pregnancy test in clinic today was negative.  Appropriate time out taken.  Patient's left arm was prepped and draped in the usual sterile fashion.. The ruler used to measure and mark insertion area.  Patient was prepped with alcohol swab and then injected with 3 ml of 1% lidocaine.  She was prepped with betadine, Nexplanon removed from packaging,  Device confirmed in needle, then inserted full length of needle and withdrawn per handbook instructions. Nexplanon was able to palpated in the patient's arm; patient palpated the insert herself. There was minimal blood loss.  Patient insertion site covered with guaze and a pressure bandage to reduce  any bruising.  The patient tolerated the procedure well and was given post procedure instructions.    Assessment:   1. Postpartum examination following cesarean delivery --Doing well, bonding well with baby, good support at home. --Incision well approximated, one small area ~ 2 cm from right margin with raised area of scar tissue (less than 0.25 cm x 0.25)  2. Preterm delivery --IOL for severe PEC, cesarean for failure to progress  3. Pre-eclampsia in third trimester --Pt stopped medications in first week postpartum but took BPs at home which were normal. Normotensive  today.  --F/U BP check with PCP   4. Nexplanon insertion --Pt has had Nexplanon x 3 before, reports amenorrhea historically. Reviewed risks of irregular menses. --See above procedure note.       Plan:   Essential components of care per ACOG recommendations:  1.  Mood and well being: Patient with negative depression screening today. Reviewed local resources for support.  - Patient tobacco use? No.   - hx of drug use? No.    2. Infant care and feeding:  -Patient currently breastmilk feeding? Yes. Reviewed importance of draining breast regularly to support lactation.  -Social determinants of health (SDOH) reviewed in EPIC. No concerns   3. Sexuality, contraception and birth spacing --Nexplanon placed today, see procedure note above  4. Sleep and fatigue -Encouraged family/partner/community support of 4 hrs of uninterrupted sleep to help with mood and fatigue  5. Physical Recovery  - Discussed patients delivery and complications. She describes her labor as good. - Patient had a C-section failure to progress.  Patient expressed understanding - Patient has urinary incontinence? No. - Patient is safe to resume physical and sexual activity  6.  Health Maintenance - HM due items addressed Yes - Last pap smear No results found for: "DIAGPAP" Pap smear not done at today's visit.  -Breast Cancer screening indicated? No.   7. Chronic Disease/Pregnancy Condition follow up: Hypertension  - PCP follow up  Fatima Blank, [place] for [place]

## 2022-12-06 ENCOUNTER — Ambulatory Visit (HOSPITAL_BASED_OUTPATIENT_CLINIC_OR_DEPARTMENT_OTHER): Payer: Self-pay | Admitting: Obstetrics & Gynecology

## 2022-12-22 ENCOUNTER — Ambulatory Visit (HOSPITAL_BASED_OUTPATIENT_CLINIC_OR_DEPARTMENT_OTHER): Payer: 59 | Admitting: Advanced Practice Midwife

## 2022-12-22 ENCOUNTER — Encounter (HOSPITAL_COMMUNITY): Payer: Self-pay | Admitting: Family Medicine

## 2023-05-31 ENCOUNTER — Other Ambulatory Visit (HOSPITAL_BASED_OUTPATIENT_CLINIC_OR_DEPARTMENT_OTHER): Payer: Self-pay

## 2023-05-31 MED ORDER — INFLUENZA VIRUS VACC SPLIT PF (FLUZONE) 0.5 ML IM SUSY: 0.5000 mL | PREFILLED_SYRINGE | Freq: Once | INTRAMUSCULAR | 0 refills | Status: AC

## 2023-06-14 ENCOUNTER — Other Ambulatory Visit (HOSPITAL_BASED_OUTPATIENT_CLINIC_OR_DEPARTMENT_OTHER): Payer: Self-pay

## 2023-06-14 ENCOUNTER — Telehealth: Payer: 59 | Admitting: Physician Assistant

## 2023-06-14 DIAGNOSIS — B9689 Other specified bacterial agents as the cause of diseases classified elsewhere: Secondary | ICD-10-CM | POA: Diagnosis not present

## 2023-06-14 DIAGNOSIS — N760 Acute vaginitis: Secondary | ICD-10-CM

## 2023-06-14 MED ORDER — METRONIDAZOLE 500 MG PO TABS: 500.0000 mg | ORAL_TABLET | Freq: Two times a day (BID) | ORAL | 0 refills | Status: DC

## 2023-06-14 MED ORDER — COVID-19 MRNA VAC-TRIS(PFIZER) 30 MCG/0.3ML IM SUSY
0.3000 mL | PREFILLED_SYRINGE | Freq: Once | INTRAMUSCULAR | 0 refills | Status: AC
  Filled 2023-06-14: qty 0.3, 1d supply, fill #0

## 2023-06-14 MED ORDER — INFLUENZA VIRUS VACC SPLIT PF (FLUZONE) 0.5 ML IM SUSY
0.5000 mL | PREFILLED_SYRINGE | Freq: Once | INTRAMUSCULAR | 0 refills | Status: AC
  Filled 2023-06-14: qty 0.5, 1d supply, fill #0

## 2023-06-14 NOTE — Progress Notes (Signed)

## 2023-06-14 NOTE — Progress Notes (Signed)
I have spent 5 minutes in review of e-visit questionnaire, review and updating patient chart, medical decision making and response to patient.   Mia Milan Cody Jacklynn Dehaas, PA-C    

## 2023-06-30 ENCOUNTER — Telehealth: Payer: 59 | Admitting: Family Medicine

## 2023-06-30 DIAGNOSIS — K0889 Other specified disorders of teeth and supporting structures: Secondary | ICD-10-CM | POA: Diagnosis not present

## 2023-06-30 MED ORDER — IBUPROFEN 600 MG PO TABS: 600.0000 mg | ORAL_TABLET | Freq: Three times a day (TID) | ORAL | 0 refills | Status: AC | PRN

## 2023-06-30 MED ORDER — AMOXICILLIN-POT CLAVULANATE 875-125 MG PO TABS: 1.0000 | ORAL_TABLET | Freq: Two times a day (BID) | ORAL | 0 refills | Status: AC

## 2023-06-30 NOTE — Progress Notes (Signed)
E-Visit for Dental Pain  We are sorry that you are not feeling well.  Here is how we plan to help!  Based on what you have shared with me in the questionnaire, it sounds like you have tooth pain  Augmentin 875-125mg  twice a day for 7 days and Ibuprofen 600mg  3 times a day for 7 days for discomfort  It is imperative that you see a dentist within 10 days of this eVisit to determine the cause of the dental pain and be sure it is adequately treated  A toothache or tooth pain is caused when the nerve in the root of a tooth or surrounding a tooth is irritated. Dental (tooth) infection, decay, injury, or loss of a tooth are the most common causes of dental pain. Pain may also occur after an extraction (tooth is pulled out). Pain sometimes originates from other areas and radiates to the jaw, thus appearing to be tooth pain.Bacteria growing inside your mouth can contribute to gum disease and dental decay, both of which can cause pain. A toothache occurs from inflammation of the central portion of the tooth called pulp. The pulp contains nerve endings that are very sensitive to pain. Inflammation to the pulp or pulpitis may be caused by dental cavities, trauma, and infection.    HOME CARE:   For toothaches: Over-the-counter pain medications such as acetaminophen or ibuprofen may be used. Take these as directed on the package while you arrange for a dental appointment. Avoid very cold or hot foods, because they may make the pain worse. You may get relief from biting on a cotton ball soaked in oil of cloves. You can get oil of cloves at most drug stores.  For jaw pain:  Aspirin may be helpful for problems in the joint of the jaw in adults. If pain happens every time you open your mouth widely, the temporomandibular joint (TMJ) may be the source of the pain. Yawning or taking a large bite of food may worsen the pain. An appointment with your doctor or dentist will help you find the cause.     GET HELP  RIGHT AWAY IF:  You have a high fever or chills If you have had a recent head or face injury and develop headache, light headedness, nausea, vomiting, or other symptoms that concern you after an injury to your face or mouth, you could have a more serious injury in addition to your dental injury. A facial rash associated with a toothache: This condition may improve with medication. Contact your doctor for them to decide what is appropriate. Any jaw pain occurring with chest pain: Although jaw pain is most commonly caused by dental disease, it is sometimes referred pain from other areas. People with heart disease, especially people who have had stents placed, people with diabetes, or those who have had heart surgery may have jaw pain as a symptom of heart attack or angina. If your jaw or tooth pain is associated with lightheadedness, sweating, or shortness of breath, you should see a doctor as soon as possible. Trouble swallowing or excessive pain or bleeding from gums: If you have a history of a weakened immune system, diabetes, or steroid use, you may be more susceptible to infections. Infections can often be more severe and extensive or caused by unusual organisms. Dental and gum infections in people with these conditions may require more aggressive treatment. An abscess may need draining or IV antibiotics, for example.  MAKE SURE YOU   Understand these instructions. Will  watch your condition. Will get help right away if you are not doing well or get worse.  Thank you for choosing an e-visit.  Your e-visit answers were reviewed by a board certified advanced clinical practitioner to complete your personal care plan. Depending upon the condition, your plan could have included both over the counter or prescription medications.  Please review your pharmacy choice. Make sure the pharmacy is open so you can pick up prescription now. If there is a problem, you may contact your provider through [employer] and have the prescription routed to another pharmacy.  Your safety is important to [place]. If you have drug allergies check your prescription carefully.   For the next 24 hours you can use MyChart to ask questions about today's visit, request a non-urgent call back, or ask for a work or school excuse. You will get an email in the next two days asking about your experience. I hope that your e-visit has been valuable and will speed your recovery.  I have spent 5 minutes in review of e-visit questionnaire, review and updating patient chart, medical decision making and response to patient.   Reed Pandy, PA-C

## 2023-07-24 ENCOUNTER — Other Ambulatory Visit: Payer: Self-pay

## 2023-07-30 ENCOUNTER — Telehealth: Payer: 59 | Admitting: Physician Assistant

## 2023-07-30 DIAGNOSIS — N760 Acute vaginitis: Secondary | ICD-10-CM | POA: Diagnosis not present

## 2023-07-30 DIAGNOSIS — B379 Candidiasis, unspecified: Secondary | ICD-10-CM | POA: Diagnosis not present

## 2023-07-30 DIAGNOSIS — B9689 Other specified bacterial agents as the cause of diseases classified elsewhere: Secondary | ICD-10-CM | POA: Diagnosis not present

## 2023-07-30 DIAGNOSIS — T3695XA Adverse effect of unspecified systemic antibiotic, initial encounter: Secondary | ICD-10-CM

## 2023-07-30 MED ORDER — FLUCONAZOLE 150 MG PO TABS: 150.0000 mg | ORAL_TABLET | Freq: Once | ORAL | 0 refills | Status: AC

## 2023-07-30 MED ORDER — METRONIDAZOLE 500 MG PO TABS: 500.0000 mg | ORAL_TABLET | Freq: Two times a day (BID) | ORAL | 0 refills | Status: AC

## 2023-07-30 NOTE — Progress Notes (Signed)
E-Visit for Vaginal Symptoms  We are sorry that you are not feeling well. Here is how we plan to help! Based on what you shared with me it looks like you: May have a vaginosis due to bacteria  Vaginosis is an inflammation of the vagina that can result in discharge, itching and pain. The cause is usually a change in the normal balance of vaginal bacteria or an infection. Vaginosis can also result from reduced estrogen levels after menopause.  The most common causes of vaginosis are:   Bacterial vaginosis which results from an overgrowth of one on several organisms that are normally present in your vagina.   Yeast infections which are caused by a naturally occurring fungus called candida.   Vaginal atrophy (atrophic vaginosis) which results from the thinning of the vagina from reduced estrogen levels after menopause.   Trichomoniasis which is caused by a parasite and is commonly transmitted by sexual intercourse.  Factors that increase your risk of developing vaginosis include: Medications, such as antibiotics and steroids Uncontrolled diabetes Use of hygiene products such as bubble bath, vaginal spray or vaginal deodorant Douching Wearing damp or tight-fitting clothing Using an intrauterine device (IUD) for birth control Hormonal changes, such as those associated with pregnancy, birth control pills or menopause Sexual activity Having a sexually transmitted infection  Your treatment plan is Metronidazole or Flagyl 500mg  twice a day for 7 days.  I have electronically sent this prescription into the pharmacy that you have chosen. I have also prescribed Fluconazole to take once the Metronidazole is completed for possible antibiotic induced yeast infection.   Be sure to take all of the medication as directed. Stop taking any medication if you develop a rash, tongue swelling or shortness of breath. Mothers who are breast feeding should consider pumping and discarding their breast milk while on  these antibiotics. However, there is no consensus that infant exposure at these doses would be harmful.  Remember that medication creams can weaken latex condoms. Marland Kitchen   HOME CARE:  Good hygiene may prevent some types of vaginosis from recurring and may relieve some symptoms:  Avoid baths, hot tubs and whirlpool spas. Rinse soap from your outer genital area after a shower, and dry the area well to prevent irritation. Don't use scented or harsh soaps, such as those with deodorant or antibacterial action. Avoid irritants. These include scented tampons and pads. Wipe from front to back after using the toilet. Doing so avoids spreading fecal bacteria to your vagina.  Other things that may help prevent vaginosis include:  Don't douche. Your vagina doesn't require cleansing other than normal bathing. Repetitive douching disrupts the normal organisms that reside in the vagina and can actually increase your risk of vaginal infection. Douching won't clear up a vaginal infection. Use a latex condom. Both female and female latex condoms may help you avoid infections spread by sexual contact. Wear cotton underwear. Also wear pantyhose with a cotton crotch. If you feel comfortable without it, skip wearing underwear to bed. Yeast thrives in [employer] Your symptoms should improve in the next day or two.  GET HELP RIGHT AWAY IF:  You have pain in your lower abdomen ( pelvic area or over your ovaries) You develop nausea or vomiting You develop a fever Your discharge changes or worsens You have persistent pain with intercourse You develop shortness of breath, a rapid pulse, or you faint.  These symptoms could be signs of problems or infections that need to be evaluated by a medical  provider now.  MAKE SURE YOU   Understand these instructions. Will watch your condition. Will get help right away if you are not doing well or get worse.  Thank you for choosing an e-visit.  Your e-visit answers  were reviewed by a board certified advanced clinical practitioner to complete your personal care plan. Depending upon the condition, your plan could have included both over the counter or prescription medications.  Please review your pharmacy choice. Make sure the pharmacy is open so you can pick up prescription now. If there is a problem, you may contact your provider through [employer] and have the prescription routed to another pharmacy.  Your safety is important to [place]. If you have drug allergies check your prescription carefully.   For the next 24 hours you can use MyChart to ask questions about today's visit, request a non-urgent call back, or ask for a work or school excuse. You will get an email in the next two days asking about your experience. I hope that your e-visit has been valuable and will speed your recovery.  I have spent 5 minutes in review of e-visit questionnaire, review and updating patient chart, medical decision making and response to patient.   Margaretann Loveless, PA-C

## 2023-08-28 ENCOUNTER — Telehealth: Payer: 59 | Admitting: Physician Assistant

## 2023-08-28 DIAGNOSIS — B9689 Other specified bacterial agents as the cause of diseases classified elsewhere: Secondary | ICD-10-CM | POA: Diagnosis not present

## 2023-08-28 DIAGNOSIS — N760 Acute vaginitis: Secondary | ICD-10-CM | POA: Diagnosis not present

## 2023-08-28 MED ORDER — FLUCONAZOLE 150 MG PO TABS: ORAL_TABLET | ORAL | 0 refills | Status: DC

## 2023-08-28 MED ORDER — METRONIDAZOLE 500 MG PO TABS: 500.0000 mg | ORAL_TABLET | Freq: Two times a day (BID) | ORAL | 0 refills | Status: DC

## 2023-08-28 NOTE — Progress Notes (Signed)
I have spent 5 minutes in review of e-visit questionnaire, review and updating patient chart, medical decision making and response to patient.   Mia Milan Cody Jacklynn Dehaas, PA-C    

## 2023-08-28 NOTE — Progress Notes (Signed)
E-Visit for Vaginal Symptoms  We are sorry that you are not feeling well. Here is how we plan to help! Based on what you shared with me it looks like you: May have a vaginosis due to bacteria  Vaginosis is an inflammation of the vagina that can result in discharge, itching and pain. The cause is usually a change in the normal balance of vaginal bacteria or an infection. Vaginosis can also result from reduced estrogen levels after menopause.  The most common causes of vaginosis are:   Bacterial vaginosis which results from an overgrowth of one on several organisms that are normally present in your vagina.   Yeast infections which are caused by a naturally occurring fungus called candida.   Vaginal atrophy (atrophic vaginosis) which results from the thinning of the vagina from reduced estrogen levels after menopause.   Trichomoniasis which is caused by a parasite and is commonly transmitted by sexual intercourse.  Factors that increase your risk of developing vaginosis include: Medications, such as antibiotics and steroids Uncontrolled diabetes Use of hygiene products such as bubble bath, vaginal spray or vaginal deodorant Douching Wearing damp or tight-fitting clothing Using an intrauterine device (IUD) for birth control Hormonal changes, such as those associated with pregnancy, birth control pills or menopause Sexual activity Having a sexually transmitted infection  Your treatment plan is Metronidazole or Flagyl 500mg  twice a day for 7 days.  I have electronically sent this prescription into the pharmacy that you have chosen. I have added on a Diflucan in case of an antibiotic-induced yeast infection. You may want to consider starting a women's vaginal health probiotic ([employer] makes one of these). You can also talk with a primary care provider about the possibility of boric acid suppositories.  Be sure to take all of the medication as directed. Stop taking any medication if you  develop a rash, tongue swelling or shortness of breath. Mothers who are breast feeding should consider pumping and discarding their breast milk while on these antibiotics. However, there is no consensus that infant exposure at these doses would be harmful.  Remember that medication creams can weaken latex condoms. Marland Kitchen   HOME CARE:  Good hygiene may prevent some types of vaginosis from recurring and may relieve some symptoms:  Avoid baths, hot tubs and whirlpool spas. Rinse soap from your outer genital area after a shower, and dry the area well to prevent irritation. Don't use scented or harsh soaps, such as those with deodorant or antibacterial action. Avoid irritants. These include scented tampons and pads. Wipe from front to back after using the toilet. Doing so avoids spreading fecal bacteria to your vagina.  Other things that may help prevent vaginosis include:  Don't douche. Your vagina doesn't require cleansing other than normal bathing. Repetitive douching disrupts the normal organisms that reside in the vagina and can actually increase your risk of vaginal infection. Douching won't clear up a vaginal infection. Use a latex condom. Both female and female latex condoms may help you avoid infections spread by sexual contact. Wear cotton underwear. Also wear pantyhose with a cotton crotch. If you feel comfortable without it, skip wearing underwear to bed. Yeast thrives in [employer] Your symptoms should improve in the next day or two.  GET HELP RIGHT AWAY IF:  You have pain in your lower abdomen ( pelvic area or over your ovaries) You develop nausea or vomiting You develop a fever Your discharge changes or worsens You have persistent pain with intercourse You develop shortness  of breath, a rapid pulse, or you faint.  These symptoms could be signs of problems or infections that need to be evaluated by a medical provider now.  MAKE SURE YOU   Understand these  instructions. Will watch your condition. Will get help right away if you are not doing well or get worse.  Thank you for choosing an e-visit.  Your e-visit answers were reviewed by a board certified advanced clinical practitioner to complete your personal care plan. Depending upon the condition, your plan could have included both over the counter or prescription medications.  Please review your pharmacy choice. Make sure the pharmacy is open so you can pick up prescription now. If there is a problem, you may contact your provider through [employer] and have the prescription routed to another pharmacy.  Your safety is important to [place]. If you have drug allergies check your prescription carefully.   For the next 24 hours you can use MyChart to ask questions about today's visit, request a non-urgent call back, or ask for a work or school excuse. You will get an email in the next two days asking about your experience. I hope that your e-visit has been valuable and will speed your recovery.

## 2023-09-22 ENCOUNTER — Telehealth: Payer: 59 | Admitting: Nurse Practitioner

## 2023-09-22 DIAGNOSIS — K047 Periapical abscess without sinus: Secondary | ICD-10-CM | POA: Diagnosis not present

## 2023-09-22 MED ORDER — IBUPROFEN 600 MG PO TABS: 600.0000 mg | ORAL_TABLET | Freq: Three times a day (TID) | ORAL | 0 refills | Status: DC | PRN

## 2023-09-22 MED ORDER — PENICILLIN V POTASSIUM 500 MG PO TABS: 500.0000 mg | ORAL_TABLET | Freq: Three times a day (TID) | ORAL | 0 refills | Status: AC

## 2023-09-22 NOTE — Progress Notes (Signed)

## 2023-09-29 DIAGNOSIS — H6692 Otitis media, unspecified, left ear: Secondary | ICD-10-CM | POA: Diagnosis not present

## 2023-09-29 DIAGNOSIS — R509 Fever, unspecified: Secondary | ICD-10-CM | POA: Diagnosis not present

## 2023-09-29 DIAGNOSIS — R6889 Other general symptoms and signs: Secondary | ICD-10-CM | POA: Diagnosis not present

## 2023-10-01 DIAGNOSIS — Z1152 Encounter for screening for COVID-19: Secondary | ICD-10-CM | POA: Diagnosis not present

## 2023-10-01 DIAGNOSIS — Z20822 Contact with and (suspected) exposure to covid-19: Secondary | ICD-10-CM | POA: Diagnosis not present

## 2023-10-01 NOTE — Progress Notes (Signed)
I have spent 5 minutes in review of e-visit questionnaire, review and updating patient chart, medical decision making and response to patient.   Claiborne Rigg, NP

## 2023-10-10 DIAGNOSIS — J029 Acute pharyngitis, unspecified: Secondary | ICD-10-CM | POA: Diagnosis not present

## 2023-10-10 DIAGNOSIS — Z20828 Contact with and (suspected) exposure to other viral communicable diseases: Secondary | ICD-10-CM | POA: Diagnosis not present

## 2023-10-10 DIAGNOSIS — R6889 Other general symptoms and signs: Secondary | ICD-10-CM | POA: Diagnosis not present

## 2023-10-10 DIAGNOSIS — R0981 Nasal congestion: Secondary | ICD-10-CM | POA: Diagnosis not present

## 2023-10-10 DIAGNOSIS — R509 Fever, unspecified: Secondary | ICD-10-CM | POA: Diagnosis not present

## 2023-11-19 ENCOUNTER — Telehealth: Admitting: Physician Assistant

## 2023-11-19 DIAGNOSIS — B9689 Other specified bacterial agents as the cause of diseases classified elsewhere: Secondary | ICD-10-CM

## 2023-11-19 DIAGNOSIS — B3731 Acute candidiasis of vulva and vagina: Secondary | ICD-10-CM | POA: Diagnosis not present

## 2023-11-19 DIAGNOSIS — N760 Acute vaginitis: Secondary | ICD-10-CM

## 2023-11-19 MED ORDER — METRONIDAZOLE 500 MG PO TABS: 500.0000 mg | ORAL_TABLET | Freq: Two times a day (BID) | ORAL | 0 refills | Status: DC

## 2023-11-19 MED ORDER — FLUCONAZOLE 150 MG PO TABS: ORAL_TABLET | ORAL | 0 refills | Status: DC

## 2023-11-19 NOTE — Progress Notes (Signed)
 I have spent 5 minutes in review of e-visit questionnaire, review and updating patient chart, medical decision making and response to patient.   Piedad Climes, PA-C

## 2023-11-19 NOTE — Progress Notes (Signed)

## 2023-11-19 NOTE — Progress Notes (Signed)
 Message sent to patient requesting further input regarding current symptoms. Awaiting patient response.

## 2023-12-19 ENCOUNTER — Telehealth: Admitting: Physician Assistant

## 2023-12-19 DIAGNOSIS — J069 Acute upper respiratory infection, unspecified: Secondary | ICD-10-CM | POA: Diagnosis not present

## 2023-12-19 MED ORDER — BENZONATATE 100 MG PO CAPS: 100.0000 mg | ORAL_CAPSULE | Freq: Three times a day (TID) | ORAL | 0 refills | Status: DC | PRN

## 2023-12-19 MED ORDER — FLUTICASONE PROPIONATE 50 MCG/ACT NA SUSP: 2.0000 | Freq: Every day | NASAL | 0 refills | Status: AC

## 2023-12-19 NOTE — Progress Notes (Signed)
 I have spent 5 minutes in review of e-visit questionnaire, review and updating patient chart, medical decision making and response to patient.   Piedad Climes, PA-C

## 2023-12-19 NOTE — Progress Notes (Signed)

## 2023-12-26 ENCOUNTER — Other Ambulatory Visit: Payer: Self-pay

## 2024-03-20 ENCOUNTER — Telehealth: Admitting: Physician Assistant

## 2024-03-20 DIAGNOSIS — B3731 Acute candidiasis of vulva and vagina: Secondary | ICD-10-CM

## 2024-03-20 MED ORDER — FLUCONAZOLE 150 MG PO TABS: ORAL_TABLET | ORAL | 0 refills | Status: DC

## 2024-03-20 NOTE — Progress Notes (Signed)
 I have spent 5 minutes in review of e-visit questionnaire, review and updating patient chart, medical decision making and response to patient.   Piedad Climes, PA-C

## 2024-03-20 NOTE — Progress Notes (Signed)

## 2024-04-09 ENCOUNTER — Ambulatory Visit: Admitting: Family Medicine

## 2024-05-07 ENCOUNTER — Ambulatory Visit: Admitting: Family Medicine

## 2024-06-05 DIAGNOSIS — D72829 Elevated white blood cell count, unspecified: Secondary | ICD-10-CM | POA: Diagnosis not present

## 2024-06-05 DIAGNOSIS — R0989 Other specified symptoms and signs involving the circulatory and respiratory systems: Secondary | ICD-10-CM | POA: Diagnosis not present

## 2024-06-05 DIAGNOSIS — R519 Headache, unspecified: Secondary | ICD-10-CM | POA: Diagnosis not present

## 2024-06-05 DIAGNOSIS — B349 Viral infection, unspecified: Secondary | ICD-10-CM | POA: Diagnosis not present

## 2024-06-05 DIAGNOSIS — R0981 Nasal congestion: Secondary | ICD-10-CM | POA: Diagnosis not present

## 2024-06-05 DIAGNOSIS — Z20822 Contact with and (suspected) exposure to covid-19: Secondary | ICD-10-CM | POA: Diagnosis not present

## 2024-06-05 DIAGNOSIS — R509 Fever, unspecified: Secondary | ICD-10-CM | POA: Diagnosis not present

## 2024-06-05 DIAGNOSIS — R0789 Other chest pain: Secondary | ICD-10-CM | POA: Diagnosis not present

## 2024-06-05 DIAGNOSIS — J029 Acute pharyngitis, unspecified: Secondary | ICD-10-CM | POA: Diagnosis not present

## 2024-06-11 ENCOUNTER — Other Ambulatory Visit (HOSPITAL_BASED_OUTPATIENT_CLINIC_OR_DEPARTMENT_OTHER): Payer: Self-pay

## 2024-06-11 MED ORDER — FLUZONE 0.5 ML IM SUSY
0.5000 mL | PREFILLED_SYRINGE | Freq: Once | INTRAMUSCULAR | 0 refills | Status: AC
  Filled 2024-06-11: qty 0.5, 1d supply, fill #0

## 2024-06-30 ENCOUNTER — Telehealth: Admitting: Family Medicine

## 2024-06-30 DIAGNOSIS — K047 Periapical abscess without sinus: Secondary | ICD-10-CM | POA: Diagnosis not present

## 2024-06-30 MED ORDER — AMOXICILLIN-POT CLAVULANATE 875-125 MG PO TABS: 1.0000 | ORAL_TABLET | Freq: Two times a day (BID) | ORAL | 0 refills | Status: AC

## 2024-06-30 NOTE — Progress Notes (Signed)
 E-Visit for Dental Pain  We are sorry that you are not feeling well.  Here is how we plan to help!  Based on what you have shared with me in the questionnaire, it sounds like you have dental infection  Augmentin  875-125mg  twice a day for 7 days  It is imperative that you see a dentist within 10 days of this eVisit to determine the cause of the dental pain and be sure it is adequately treated  A toothache or tooth pain is caused when the nerve in the root of a tooth or surrounding a tooth is irritated. Dental (tooth) infection, decay, injury, or loss of a tooth are the most common causes of dental pain. Pain may also occur after an extraction (tooth is pulled out). Pain sometimes originates from other areas and radiates to the jaw, thus appearing to be tooth pain.Bacteria growing inside your mouth can contribute to gum disease and dental decay, both of which can cause pain. A toothache occurs from inflammation of the central portion of the tooth called pulp. The pulp contains nerve endings that are very sensitive to pain. Inflammation to the pulp or pulpitis may be caused by dental cavities, trauma, and infection.    HOME CARE:   For toothaches: Over-the-counter pain medications such as acetaminophen  or ibuprofen  may be used. Take these as directed on the package while you arrange for a dental appointment. Avoid very cold or hot foods, because they may make the pain worse. You may get relief from biting on a cotton ball soaked in oil of cloves. You can get oil of cloves at most drug stores.  For jaw pain:  Aspirin  may be helpful for problems in the joint of the jaw in adults. If pain happens every time you open your mouth widely, the temporomandibular joint (TMJ) may be the source of the pain. Yawning or taking a large bite of food may worsen the pain. An appointment with your doctor or dentist will help you find the cause.     GET HELP RIGHT AWAY IF:  You have a high fever or chills If  you have had a recent head or face injury and develop headache, light headedness, nausea, vomiting, or other symptoms that concern you after an injury to your face or mouth, you could have a more serious injury in addition to your dental injury. A facial rash associated with a toothache: This condition may improve with medication. Contact your doctor for them to decide what is appropriate. Any jaw pain occurring with chest pain: Although jaw pain is most commonly caused by dental disease, it is sometimes referred pain from other areas. People with heart disease, especially people who have had stents placed, people with diabetes, or those who have had heart surgery may have jaw pain as a symptom of heart attack or angina. If your jaw or tooth pain is associated with lightheadedness, sweating, or shortness of breath, you should see a doctor as soon as possible. Trouble swallowing or excessive pain or bleeding from gums: If you have a history of a weakened immune system, diabetes, or steroid use, you may be more susceptible to infections. Infections can often be more severe and extensive or caused by unusual organisms. Dental and gum infections in people with these conditions may require more aggressive treatment. An abscess may need draining or IV antibiotics, for example.  MAKE SURE YOU   Understand these instructions. Will watch your condition. Will get help right away if you are not  doing well or get worse.  Thank you for choosing an e-visit.  Your e-visit answers were reviewed by a board certified advanced clinical practitioner to complete your personal care plan. Depending upon the condition, your plan could have included both over the counter or prescription medications.  Please review your pharmacy choice. Make sure the pharmacy is open so you can pick up prescription now. If there is a problem, you may contact your provider through [employer] and have the prescription routed to another  pharmacy.  Your safety is important to us . If you have drug allergies check your prescription carefully.   For the next 24 hours you can use MyChart to ask questions about today's visit, request a non-urgent call back, or ask for a work or school excuse. You will get an email in the next two days asking about your experience. I hope that your e-visit has been valuable and will speed your recovery.  I have spent 5 minutes in review of e-visit questionnaire, review and updating patient chart, medical decision making and response to patient.   Valbona Slabach, FNP

## 2024-09-11 ENCOUNTER — Other Ambulatory Visit: Payer: Self-pay

## 2024-09-11 ENCOUNTER — Encounter (HOSPITAL_COMMUNITY): Payer: Self-pay | Admitting: Pharmacist

## 2024-09-11 ENCOUNTER — Encounter: Payer: Self-pay | Admitting: Family Medicine

## 2024-09-11 ENCOUNTER — Other Ambulatory Visit (HOSPITAL_COMMUNITY): Payer: Self-pay

## 2024-09-11 ENCOUNTER — Ambulatory Visit: Admitting: Family Medicine

## 2024-09-11 VITALS — BP 132/70 | Temp 98.6°F | Ht 63.0 in | Wt 178.2 lb

## 2024-09-11 DIAGNOSIS — F411 Generalized anxiety disorder: Secondary | ICD-10-CM | POA: Diagnosis not present

## 2024-09-11 DIAGNOSIS — F322 Major depressive disorder, single episode, severe without psychotic features: Secondary | ICD-10-CM | POA: Insufficient documentation

## 2024-09-11 DIAGNOSIS — Z7689 Persons encountering health services in other specified circumstances: Secondary | ICD-10-CM | POA: Insufficient documentation

## 2024-09-11 MED ORDER — ESCITALOPRAM OXALATE 10 MG PO TABS
10.0000 mg | ORAL_TABLET | Freq: Every day | ORAL | 1 refills | Status: AC
  Filled 2024-09-11: qty 90, 90d supply, fill #0

## 2024-09-11 NOTE — Patient Instructions (Addendum)
 ADHD testing:  [facility]  [phone] [place], [place] [address] [phone] [facility] [address] [place], [place] [address] [phone] [facility]  [address] [place], [place]  [phone] [facility] [address] [place], [place] [address] Phone [phone]   Taking your blood pressure the proper way is important to ensure an accurate reading. Purchase a cuff that fits your arm size appropriately. Sit with your back against the chair and feet on the floor without your legs crossed.  Sit quietly for at least 5 minutes, (10 minutes is best)  before you start to measure your blood pressure. Prop your arm so that the arm is resting at your heart level. Record the reading to bring to your next follow-up visit.

## 2024-09-11 NOTE — Progress Notes (Signed)
 "  New Patient Office Visit  Subjective    Patient ID: Rachael [name], female    DOB: 08/31/1991  Age: 34 y.o. MRN: [id number]  CC:  Chief Complaint  Patient presents with   Establish Care    HPI Rachael [name] presents to establish care with this practice. She is new to me.  High blood pressure during pregnancy. Treated with nifedipine  post baby. Well controlled upon recheck today.    ADHD:  Diagnosed as a child. Never got treated. Needs to get tested.  Information provided for testing. She will follow-up with PCP as indicated.  Anxiety:  Has never been on medications in the past.  Interested in starting medication today.  Has a two year old child and does not get support from child father.  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  PHQ-9: 0 GAD-7: 18 Lexapro  10 mg daily. Start with 5 mg (half tablet) for first 8 days, then take 10 mg (whole tablet) on day 9 Referral placed for counseling. Follow-up with  new PCP in 3-4 months, sooner if anything changes.       Outpatient Encounter Medications as of 09/11/2024  Medication Sig   escitalopram  (LEXAPRO ) 10 MG tablet Take 1 tablet (10 mg total) by mouth daily.   acetaminophen  (TYLENOL ) 500 MG tablet Take 2 tablets (1,000 mg total) by mouth every 8 (eight) hours as needed. (Patient not taking: Reported on 09/11/2024)   amoxicillin -clavulanate (AUGMENTIN ) 875-125 MG tablet Take 1 tablet by mouth 2 (two) times daily. (Patient not taking: Reported on 09/11/2024)   benzonatate  (TESSALON ) 100 MG capsule Take 1 capsule (100 mg total) by mouth 3 (three) times daily as needed for cough.   ferrous sulfate  325 (65 FE) MG tablet Take 1 tablet (325 mg total) by mouth every other day.   fluconazole  (DIFLUCAN ) 150 MG tablet Take 1 tablet PO once. Repeat in 3 days if needed.   fluticasone  (FLONASE ) 50 MCG/ACT nasal spray Place 2 sprays into both nostrils daily. (Patient not taking: Reported on 09/11/2024)   furosemide  (LASIX ) 20 MG tablet Take 1  tablet (20 mg total) by mouth daily for 3 days.   NIFEdipine  (ADALAT  CC) 60 MG 24 hr tablet Take 1 tablet (60 mg total) by mouth daily.   oxyCODONE  (OXY IR/ROXICODONE ) 5 MG immediate release tablet Take 1-2 tablets (5-10 mg total) by mouth every 4 (four) hours as needed for moderate pain.   Prenatal Vit-Fe Fumarate-FA (PRENATAL VITAMIN) 27-0.8 MG TABS Take 1 tablet by mouth daily in the afternoon. (Patient taking differently: Take 1 tablet by mouth daily.)   [DISCONTINUED] ibuprofen  (ADVIL ) 600 MG tablet Take 1 tablet (600 mg total) by mouth every 8 (eight) hours as needed. (Patient not taking: Reported on 09/11/2024)   No facility-administered encounter medications on file as of 09/11/2024.    Past Medical History:  Diagnosis Date   Difficult intubation 08/15/2014   Heart murmur     Past Surgical History:  Procedure Laterality Date   CESAREAN SECTION N/A 10/23/2022   Procedure: CESAREAN SECTION;  Surgeon: Lola Donnice HERO, MD;  Location: [facility]S;  Service: Obstetrics;  Laterality: N/A;   COSMETIC SURGERY     BBL done in [place]   WISDOM TOOTH EXTRACTION      Family History  Problem Relation Age of Onset   Asthma Mother    Hypertension Mother    Sleep apnea Mother    Heart disease Father    Heart attack Father  Sounds like arrythemia; died in 30s   Heart murmur Sister    Heart murmur Brother    Cancer Paternal Grandmother        breast cancer   Diabetes Neg Hx     Social History   Socioeconomic History   Marital status: Single    Spouse name: Not on file   Number of children: Not on file   Years of education: Not on file   Highest education level: Associate degree: occupational, scientist, product/process development, or vocational program  Occupational History   Not on file  Tobacco Use   Smoking status: Never   Smokeless tobacco: Never  Vaping Use   Vaping status: Never Used  Substance and Sexual Activity   Alcohol use: Not Currently   Drug use: Never   Sexual activity: Yes    Birth  control/protection: None  Other Topics Concern   Not on file  Social History Narrative   Lives with friend. Family lives nearby in [place]   Social Drivers of Health   Tobacco Use: Low Risk (09/11/2024)   Patient History    Smoking Tobacco Use: Never    Smokeless Tobacco Use: Never    Passive Exposure: Not on file  Financial Resource Strain: Low Risk (09/09/2024)   Overall Financial Resource Strain (CARDIA)    Difficulty of Paying Living Expenses: Not very hard  Food Insecurity: No Food Insecurity (09/09/2024)   Epic    Worried About Programme Researcher, Broadcasting/film/video in the Last Year: Never true    Ran Out of Food in the Last Year: Never true  Transportation Needs: No Transportation Needs (09/09/2024)   Epic    Lack of Transportation (Medical): No    Lack of Transportation (Non-Medical): No  Physical Activity: Insufficiently Active (09/09/2024)   Exercise Vital Sign    Days of Exercise per Week: 3 days    Minutes of Exercise per Session: 30 min  Stress: Stress Concern Present (09/09/2024)   [employer] of Occupational Health - Occupational Stress Questionnaire    Feeling of Stress: Very much  Social Connections: Socially Isolated (09/09/2024)   Social Connection and Isolation Panel    Frequency of Communication with Friends and Family: More than three times a week    Frequency of Social Gatherings with Friends and Family: Once a week    Attends Religious Services: Never    Database Administrator or Organizations: No    Attends Engineer, Structural: Not on file    Marital Status: Never married  Intimate Partner Violence: Not At Risk (05/03/2022)   Humiliation, Afraid, Rape, and Kick questionnaire    Fear of Current or Ex-Partner: No    Emotionally Abused: No    Physically Abused: No    Sexually Abused: No  Depression (PHQ2-9): Low Risk (09/11/2024)   Depression (PHQ2-9)    PHQ-2 Score: 0  Recent Concern: Depression (PHQ2-9) - High Risk (09/11/2024)   Depression (PHQ2-9)    PHQ-2  Score: 14  Alcohol Screen: Low Risk (09/09/2024)   Alcohol Screen    Last Alcohol Screening Score (AUDIT): 1  Housing: Unknown (09/09/2024)   Epic    Unable to Pay for Housing in the Last Year: No    Number of Times Moved in the Last Year: Not on file    Homeless in the Last Year: No  Utilities: Not on file  Health Literacy: Not on file    ROS      Objective    BP 132/70 (Patient Position:  Sitting, Cuff Size: Normal)   Temp 98.6 F (37 C) (Oral)   Ht 5' 3 (1.6 m)   Wt 178 lb 3.2 oz (80.8 kg)   LMP  (LMP Unknown)   SpO2 100%   Breastfeeding No   BMI 31.57 kg/m   Physical Exam Vitals and nursing note reviewed.  Constitutional:      General: She is not in acute distress.    Appearance: Normal appearance.  Pulmonary:     Effort: Pulmonary effort is normal.  Skin:    General: Skin is warm and dry.  Neurological:     General: No focal deficit present.     Mental Status: She is alert. Mental status is at baseline.  Psychiatric:        Mood and Affect: Mood normal.        Behavior: Behavior normal.        Thought Content: Thought content normal.        Judgment: Judgment normal.         Assessment & Plan:   Problem List Items Addressed This Visit     Establishing care with new doctor, encounter for - Primary   GAD (generalized anxiety disorder)   Has never been on medications in the past.  Interested in starting medication today.  Has a two year old child and does not get support from child father.  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  PHQ-9: 0 GAD-7: 18 Lexapro  10 mg daily. Start with 5 mg (half tablet) for first 8 days, then take 10 mg (whole tablet) on day 9 Referral placed for counseling. Follow-up with  new PCP in 3-4 months, sooner if anything changes.        Relevant Medications   escitalopram  (LEXAPRO ) 10 MG tablet   Other Relevant Orders   Ambulatory referral to Psychology  Information provided  for ADHD testing.   Return in about 3 months (around 12/10/2024) for GAD with new PCP .   Darice JONELLE Brownie, FNP   "

## 2024-09-11 NOTE — Assessment & Plan Note (Signed)
 Has never been on medications in the past.  Interested in starting medication today.  Has a two year old child and does not get support from child father.  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  PHQ-9: 0 GAD-7: 18 Lexapro  10 mg daily. Start with 5 mg (half tablet) for first 8 days, then take 10 mg (whole tablet) on day 9 Referral placed for counseling. Follow-up with  new PCP in 3-4 months, sooner if anything changes.

## 2024-09-12 ENCOUNTER — Other Ambulatory Visit (HOSPITAL_COMMUNITY): Payer: Self-pay

## 2024-09-12 MED ORDER — HYDROXYZINE HCL 25 MG PO TABS
25.0000 mg | ORAL_TABLET | Freq: Every evening | ORAL | 0 refills | Status: AC | PRN
  Filled 2024-09-12: qty 180, 90d supply, fill #0
